# Patient Record
Sex: Male | Born: 1980 | Race: Black or African American | Hispanic: No | Marital: Single | Smoking: Current some day smoker
Health system: Southern US, Community
[De-identification: ages and names within clinical notes are randomized; demographics above are authoritative.]

## PROBLEM LIST (undated history)

## (undated) DIAGNOSIS — R590 Localized enlarged lymph nodes: Secondary | ICD-10-CM

## (undated) DIAGNOSIS — Z202 Contact with and (suspected) exposure to infections with a predominantly sexual mode of transmission: Secondary | ICD-10-CM

## (undated) DIAGNOSIS — R197 Diarrhea, unspecified: Secondary | ICD-10-CM

## (undated) DIAGNOSIS — A749 Chlamydial infection, unspecified: Secondary | ICD-10-CM

## (undated) DIAGNOSIS — I10 Essential (primary) hypertension: Secondary | ICD-10-CM

## (undated) DIAGNOSIS — A539 Syphilis, unspecified: Secondary | ICD-10-CM

## (undated) DIAGNOSIS — B20 Human immunodeficiency virus [HIV] disease: Secondary | ICD-10-CM

## (undated) DIAGNOSIS — J45909 Unspecified asthma, uncomplicated: Secondary | ICD-10-CM

## (undated) HISTORY — DX: Essential (primary) hypertension: I10

## (undated) HISTORY — DX: Diarrhea, unspecified: R19.7

## (undated) HISTORY — DX: Unspecified asthma, uncomplicated: J45.909

## (undated) HISTORY — DX: Syphilis, unspecified: A53.9

## (undated) HISTORY — DX: Chlamydial infection, unspecified: A74.9

## (undated) HISTORY — DX: Contact with and (suspected) exposure to infections with a predominantly sexual mode of transmission: Z20.2

## (undated) HISTORY — DX: Localized enlarged lymph nodes: R59.0

---

## 1999-08-05 ENCOUNTER — Ambulatory Visit: Admission: RE | Admit: 1999-08-05 | Discharge: 1999-08-05 | Payer: Self-pay | Admitting: Internal Medicine

## 2003-02-28 ENCOUNTER — Other Ambulatory Visit: Admission: RE | Admit: 2003-02-28 | Discharge: 2003-02-28 | Payer: Self-pay | Admitting: Pulmonary Disease

## 2008-10-06 ENCOUNTER — Emergency Department (HOSPITAL_BASED_OUTPATIENT_CLINIC_OR_DEPARTMENT_OTHER): Admission: EM | Admit: 2008-10-06 | Discharge: 2008-10-06 | Payer: Self-pay | Admitting: Emergency Medicine

## 2010-08-31 ENCOUNTER — Emergency Department (HOSPITAL_BASED_OUTPATIENT_CLINIC_OR_DEPARTMENT_OTHER): Admission: EM | Admit: 2010-08-31 | Discharge: 2010-08-31 | Payer: Self-pay | Admitting: Emergency Medicine

## 2010-10-05 ENCOUNTER — Inpatient Hospital Stay (HOSPITAL_COMMUNITY): Admission: EM | Admit: 2010-10-05 | Discharge: 2010-10-06 | Payer: Self-pay | Admitting: Emergency Medicine

## 2010-11-10 ENCOUNTER — Telehealth: Payer: Self-pay | Admitting: Infectious Diseases

## 2010-11-10 ENCOUNTER — Ambulatory Visit: Payer: Self-pay | Admitting: Adult Health

## 2010-11-10 DIAGNOSIS — B20 Human immunodeficiency virus [HIV] disease: Secondary | ICD-10-CM

## 2010-11-10 LAB — CONVERTED CEMR LAB
ALT: 15 units/L (ref 0–53)
AST: 22 units/L (ref 0–37)
Albumin: 4.2 g/dL (ref 3.5–5.2)
Alkaline Phosphatase: 75 units/L (ref 39–117)
BUN: 12 mg/dL (ref 6–23)
Basophils Absolute: 0 10*3/uL (ref 0.0–0.1)
Basophils Relative: 0 % (ref 0–1)
CO2: 24 meq/L (ref 19–32)
Calcium: 9 mg/dL (ref 8.4–10.5)
Chlamydia, Swab/Urine, PCR: NEGATIVE
Chloride: 99 meq/L (ref 96–112)
Cholesterol: 99 mg/dL (ref 0–200)
Creatinine, Ser: 0.93 mg/dL (ref 0.40–1.50)
Eosinophils Absolute: 0.1 10*3/uL (ref 0.0–0.7)
Eosinophils Relative: 4 % (ref 0–5)
GC Probe Amp, Urine: NEGATIVE
Glucose, Bld: 87 mg/dL (ref 70–99)
HCT: 37.3 % — ABNORMAL LOW (ref 39.0–52.0)
HCV Ab: NEGATIVE
HDL: 19 mg/dL — ABNORMAL LOW (ref >39)
HIV 1 RNA Quant: 114000 copies/mL — ABNORMAL HIGH (ref <20)
HIV-1 RNA Quant, Log: 5.06 — ABNORMAL HIGH (ref <1.30)
HIV-1 antibody: POSITIVE — AB
HIV-2 Ab: NEGATIVE
HIV: REACTIVE
Hemoglobin, Urine: NEGATIVE
Hemoglobin: 12.4 g/dL — ABNORMAL LOW (ref 13.0–17.0)
Hep A Total Ab: POSITIVE — AB
Hep B Core Total Ab: NEGATIVE
Hep B S Ab: POSITIVE — AB
Hepatitis B Surface Ag: NEGATIVE
Ketones, ur: NEGATIVE mg/dL
LDL Cholesterol: 63 mg/dL (ref 0–99)
Leukocytes, UA: NEGATIVE
Lymphocytes Relative: 35 % (ref 12–46)
Lymphs Abs: 1.2 10*3/uL (ref 0.7–4.0)
MCHC: 33.2 g/dL (ref 30.0–36.0)
MCV: 81.4 fL (ref 78.0–100.0)
Monocytes Absolute: 0.3 10*3/uL (ref 0.1–1.0)
Monocytes Relative: 8 % (ref 3–12)
Neutro Abs: 1.8 10*3/uL (ref 1.7–7.7)
Neutrophils Relative %: 53 % (ref 43–77)
Nitrite: NEGATIVE
Platelets: 431 10*3/uL — ABNORMAL HIGH (ref 150–400)
Potassium: 4.2 meq/L (ref 3.5–5.3)
Protein, ur: NEGATIVE mg/dL
RBC: 4.58 M/uL (ref 4.22–5.81)
RDW: 13.9 % (ref 11.5–15.5)
RPR Ser Ql: REACTIVE — AB
RPR Titer: 1:64 {titer} — AB
Sodium: 135 meq/L (ref 135–145)
Specific Gravity, Urine: 1.025 (ref 1.005–1.030)
T pallidum Antibodies (TP-PA): >8 — ABNORMAL HIGH (ref <0.90)
Total Bilirubin: 0.4 mg/dL (ref 0.3–1.2)
Total CHOL/HDL Ratio: 5.2
Total Protein: 8.8 g/dL — ABNORMAL HIGH (ref 6.0–8.3)
Triglycerides: 87 mg/dL (ref <150)
Urine Glucose: NEGATIVE mg/dL
Urobilinogen, UA: 1 (ref 0.0–1.0)
VLDL: 17 mg/dL (ref 0–40)
WBC: 3.4 10*3/uL — ABNORMAL LOW (ref 4.0–10.5)
pH: 6 (ref 5.0–8.0)

## 2010-11-17 DIAGNOSIS — F329 Major depressive disorder, single episode, unspecified: Secondary | ICD-10-CM | POA: Insufficient documentation

## 2010-11-18 ENCOUNTER — Ambulatory Visit: Payer: Self-pay | Admitting: Adult Health

## 2010-11-18 DIAGNOSIS — B370 Candidal stomatitis: Secondary | ICD-10-CM | POA: Insufficient documentation

## 2010-11-18 DIAGNOSIS — R05 Cough: Secondary | ICD-10-CM | POA: Insufficient documentation

## 2010-11-18 DIAGNOSIS — A5149 Other secondary syphilitic conditions: Secondary | ICD-10-CM | POA: Insufficient documentation

## 2010-11-18 DIAGNOSIS — R291 Meningismus: Secondary | ICD-10-CM | POA: Insufficient documentation

## 2010-11-19 ENCOUNTER — Inpatient Hospital Stay (HOSPITAL_COMMUNITY): Admission: EM | Admit: 2010-11-19 | Discharge: 2010-11-20 | Payer: Self-pay | Admitting: Emergency Medicine

## 2010-11-20 ENCOUNTER — Encounter (INDEPENDENT_AMBULATORY_CARE_PROVIDER_SITE_OTHER): Payer: Self-pay | Admitting: *Deleted

## 2010-11-23 ENCOUNTER — Emergency Department (HOSPITAL_BASED_OUTPATIENT_CLINIC_OR_DEPARTMENT_OTHER)
Admission: EM | Admit: 2010-11-23 | Discharge: 2010-11-23 | Payer: Self-pay | Source: Home / Self Care | Admitting: Emergency Medicine

## 2010-11-23 ENCOUNTER — Encounter (INDEPENDENT_AMBULATORY_CARE_PROVIDER_SITE_OTHER): Payer: Self-pay | Admitting: *Deleted

## 2010-11-27 ENCOUNTER — Ambulatory Visit: Payer: Self-pay | Admitting: Adult Health

## 2010-11-27 DIAGNOSIS — G971 Other reaction to spinal and lumbar puncture: Secondary | ICD-10-CM

## 2010-12-03 ENCOUNTER — Ambulatory Visit: Payer: Self-pay | Admitting: Adult Health

## 2010-12-03 DIAGNOSIS — F411 Generalized anxiety disorder: Secondary | ICD-10-CM

## 2010-12-04 ENCOUNTER — Ambulatory Visit: Payer: Self-pay | Admitting: Adult Health

## 2010-12-25 ENCOUNTER — Ambulatory Visit
Admission: RE | Admit: 2010-12-25 | Discharge: 2010-12-25 | Payer: Self-pay | Source: Home / Self Care | Attending: Adult Health | Admitting: Adult Health

## 2010-12-25 DIAGNOSIS — G4700 Insomnia, unspecified: Secondary | ICD-10-CM | POA: Insufficient documentation

## 2010-12-25 DIAGNOSIS — R21 Rash and other nonspecific skin eruption: Secondary | ICD-10-CM | POA: Insufficient documentation

## 2010-12-25 DIAGNOSIS — L851 Acquired keratosis [keratoderma] palmaris et plantaris: Secondary | ICD-10-CM | POA: Insufficient documentation

## 2010-12-29 ENCOUNTER — Encounter (INDEPENDENT_AMBULATORY_CARE_PROVIDER_SITE_OTHER): Payer: Self-pay | Admitting: *Deleted

## 2011-01-04 ENCOUNTER — Ambulatory Visit
Admission: RE | Admit: 2011-01-04 | Discharge: 2011-01-04 | Payer: Self-pay | Source: Home / Self Care | Attending: Adult Health | Admitting: Adult Health

## 2011-01-18 ENCOUNTER — Ambulatory Visit
Admission: RE | Admit: 2011-01-18 | Discharge: 2011-01-18 | Payer: Self-pay | Source: Home / Self Care | Attending: Adult Health | Admitting: Adult Health

## 2011-01-18 ENCOUNTER — Encounter: Payer: Self-pay | Admitting: Adult Health

## 2011-01-18 LAB — CONVERTED CEMR LAB
ALT: 12 units/L (ref 0–53)
AST: 19 units/L (ref 0–37)
Albumin: 4.3 g/dL (ref 3.5–5.2)
Alkaline Phosphatase: 96 units/L (ref 39–117)
BUN: 13 mg/dL (ref 6–23)
Basophils Absolute: 0 10*3/uL (ref 0.0–0.1)
Basophils Relative: 1 % (ref 0–1)
CO2: 24 meq/L (ref 19–32)
Calcium: 9 mg/dL (ref 8.4–10.5)
Chloride: 104 meq/L (ref 96–112)
Creatinine, Ser: 0.92 mg/dL (ref 0.40–1.50)
Eosinophils Absolute: 0.3 10*3/uL (ref 0.0–0.7)
Eosinophils Relative: 7 % — ABNORMAL HIGH (ref 0–5)
Glucose, Bld: 94 mg/dL (ref 70–99)
HCT: 40 % (ref 39.0–52.0)
HIV 1 RNA Quant: 66 copies/mL — ABNORMAL HIGH (ref <20)
HIV-1 RNA Quant, Log: 1.82 — ABNORMAL HIGH (ref <1.30)
Hemoglobin: 12.7 g/dL — ABNORMAL LOW (ref 13.0–17.0)
Lymphocytes Relative: 52 % — ABNORMAL HIGH (ref 12–46)
Lymphs Abs: 2.4 10*3/uL (ref 0.7–4.0)
MCHC: 31.8 g/dL (ref 30.0–36.0)
MCV: 88.3 fL (ref 78.0–100.0)
Monocytes Absolute: 0.5 10*3/uL (ref 0.1–1.0)
Monocytes Relative: 11 % (ref 3–12)
Neutro Abs: 1.3 10*3/uL — ABNORMAL LOW (ref 1.7–7.7)
Neutrophils Relative %: 29 % — ABNORMAL LOW (ref 43–77)
Platelets: 293 10*3/uL (ref 150–400)
Potassium: 4.1 meq/L (ref 3.5–5.3)
RBC: 4.53 M/uL (ref 4.22–5.81)
RDW: 14.8 % (ref 11.5–15.5)
Sodium: 139 meq/L (ref 135–145)
Total Bilirubin: 0.4 mg/dL (ref 0.3–1.2)
Total Protein: 7.8 g/dL (ref 6.0–8.3)
WBC: 4.5 10*3/uL (ref 4.0–10.5)

## 2011-01-19 NOTE — Progress Notes (Addendum)
Summary: Rash   Phone Note Call from Patient   Summary of Call: Pt states rash appeared on his body abut 15 mins after taking the Dapsone.   Then itching occured .  He states he took the Septra about 20 mins before coming to the clinic and then took the Dapsone samples given after the intake.    Pt was advised to D/C the Septra.  Do not take the Septra or the Dapsone for two days then restart the Dapson on Friday and observe for symptoms of poss. allergic reaction. Pt was told it was probably the Septra .

## 2011-01-19 NOTE — Miscellaneous (Signed)
Summary: HIPAA Restrictions  HIPAA Restrictions   Imported By: Florinda Marker 11/17/2010 09:19:54  _____________________________________________________________________  External Attachment:    Type:   Image     Comment:   External Document

## 2011-01-19 NOTE — Miscellaneous (Signed)
Summary: RW Financial Update  Clinical Lists Changes  Observations: Added new observation of RWPARTICIP: Yes (11/20/2010 12:39) Added new observation of RWTITLE: B (11/20/2010 12:39) Added new observation of AIDSDAP: Pending-mailed app 11/20/10 (11/20/2010 12:39) Added new observation of PCTFPL: 97.29  (11/20/2010 12:39) Added new observation of INCOMESOURCE: wages  (11/20/2010 12:39) Added new observation of HOUSEINCOME: 16109  (11/20/2010 12:39) Added new observation of #CHILD<18 IN: No  (11/20/2010 12:39) Added new observation of FAMILYSIZE: 1  (11/20/2010 12:39) Added new observation of HOUSING: Stable/permanent  (11/20/2010 12:39) Added new observation of FINASSESSDT: 11/20/2010  (11/20/2010 12:39) Added new observation of YEARLYEXPEN: 0  (11/20/2010 12:39)

## 2011-01-19 NOTE — Miscellaneous (Signed)
Summary: ADAP approval   Clinical Lists Changes  Observations: Added new observation of AIDSDAP: Yes 2011 (11/23/2010 13:04)

## 2011-01-19 NOTE — Assessment & Plan Note (Signed)
Summary: new 042   / whole body pain X 1 week   Visit Type:  New Patient Primary Provider:  Talmadge Chad NP  CC:  body pain, fever, and chills.  History of Present Illness: 30 y/o African-American male with h/o HIV since 2004 in for initial eval.  He has never been on ARV therapy in the past. He was followed by [facility] for HIV "a few years ago" but stopped due to frustration with the system there.  Recently treated at [facility] for fever & "body aches,"  and at that time was sent home on Bactrim DS by mouth for PCP prophylaxis.  1 week back he called clinic stating he developed a "reaction" to Bactrim which was subsequently treated by cessation of drug, but now claims rash changing character, developing fevers with Tmax 103, dry cough, diffuse arthargias and myalgias, loss of appetite, generalized neck stiffness, weakness, nonexretional fatigue.   Denies SOB, DOE, or orthopnea.  Denies nausea, vomitting, or diarrhea.  No reported seizures, syncopal episodes.  Also relates deveoping "sores" on soles of feet during this same period of time.  Preventive Screening-Counseling & Management  Alcohol-Tobacco     Smoking Status: never      Drug Use:  no.     Current Allergies (reviewed today): ! BACTRIM Past History:  Past Medical History: HIV disease - 2004  Social History: Occupation:student Single Never Smoked Alcohol use-yes Drug use-no Drug Use:  no  Review of Systems General:  Complains of chills, fatigue, fever, loss of appetite, malaise, sweats, weakness, and weight loss. Eyes:  Denies blurring, discharge, double vision, eye irritation, eye pain, halos, itching, light sensitivity, red eye, vision loss-1 eye, and vision loss-both eyes. ENT:  Complains of ringing in ears; denies decreased hearing, difficulty swallowing, ear discharge, earache, hoarseness, nasal congestion, nosebleeds, postnasal drainage, sinus pressure, and sore throat. CV:  Complains of fatigue; denies bluish  discoloration of lips or nails, chest pain or discomfort, difficulty breathing at night, difficulty breathing while lying down, fainting, leg cramps with exertion, lightheadness, near fainting, palpitations, shortness of breath with exertion, swelling of feet, swelling of hands, and weight gain. Resp:  Complains of cough; dry nonproductive, inspiratory in nature. GI:  Complains of loss of appetite; denies abdominal pain, bloody stools, change in bowel habits, constipation, dark tarry stools, diarrhea, excessive appetite, gas, hemorrhoids, indigestion, nausea, vomiting, vomiting blood, and yellowish skin color. GU:  Denies decreased libido, discharge, dysuria, erectile dysfunction, genital sores, hematuria, incontinence, nocturia, urinary frequency, and urinary hesitancy. MS:  Complains of joint pain, loss of strength, low back pain, mid back pain, muscle aches, and stiffness; diffuse arthalgias, myalgias, "stiff neck". Derm:  Complains of dryness, lesion(s), and rash; denies changes in color of skin, changes in nail beds, excessive perspiration, flushing, hair loss, insect bite(s), itching, and poor wound healing; "sores" on soles of feet, rash "all over". Neuro:  Complains of disturbances in coordination, headaches, poor balance, and weakness; denies brief paralysis, difficulty with concentration, falling down, inability to speak, memory loss, numbness, seizures, sensation of room spinning, tingling, tremors, and visual disturbances. Psych:  Complains of depression and easily tearful; denies alternate hallucination ( auditory/visual), anxiety, easily angered, irritability, mental problems, panic attacks, sense of great danger, suicidal thoughts/plans, thoughts of violence, unusual visions or sounds, and thoughts /plans of harming others. Endo:  Complains of excessive thirst; denies cold intolerance, excessive hunger, excessive urination, heat intolerance, polyuria, and weight change. Heme:  Complains of  enlarge lymph nodes and fevers; denies abnormal bruising,  bleeding, pallor, and skin discoloration. Allergy:  Complains of hives or rash; denies itching eyes, persistent infections, seasonal allergies, and sneezing.  Vital Signs:  Patient profile:   30 year old male Height:      0 inches (0 cm) Weight:      0 pounds (0 kg) Temp:     100.5 degrees F (38.06 degrees C) Pulse rate:   114 / minute BP sitting:   101 / 64  (left arm)  Vitals Entered By: Starleen Arms CMA (November 18, 2010 3:47 PM) CC: body pain, fever, chills Is Patient Diabetic? No Pain Assessment Patient in pain? yes     Location: whole body  Does patient need assistance? Functional Status Self care   Physical Exam  General:  cooperative to examination, underweight appearing, dusky, disheveled, and mild distress.   Head:  Normocephalic and atraumatic without obvious abnormalities. No apparent alopecia or balding. Eyes:  No corneal or conjunctival inflammation noted. EOMI. Perrla. Funduscopic exam benign, without hemorrhages, exudates or papilledema.   Wears corrective lenses Ears:  External ear exam shows no significant lesions or deformities.  Otoscopic examination reveals clear canals, tympanic membranes are intact bilaterally without bulging, retraction, inflammation or discharge. Hearing is grossly normal bilaterally. Nose:  External nasal examination shows no deformity or inflammation. Nasal mucosa are pink and moist without lesions or exudates. Mouth:  fair dentition, halitosis, angular chelitis, and white plaque(s), mostly isolated to soft palate and anterior pillars. Neck:  no masses, no thyromegaly, no thyroid nodules or tenderness, no JVD, no HJR, nuchal rigidity, decreased ROM, and cervical lymphadenopathy  Chest Wall:  No deformities, masses, tenderness or gynecomastia noted. Lungs:  tachypnic, no intercostal retractions, no accessory muscle use, no dullness, no fremitus, no crackles, no wheezes, R  decreased breath sounds, and L decreased breath sounds (in bases only).   Heart:  regular rhythm, no murmur, no gallop, no rub, no lifts, no heaves, no thrills, PMI normal, and tachycardia.   Abdomen:  Bowel sounds positive,abdomen soft and non-tender without masses, organomegaly or hernias noted. Rectal:  Deferred this visit Genitalia:  Deferred Prostate:  NA Msk:  no joint swelling, no joint warmth, no redness over joints, no joint deformities, no joint instability, no crepitation, no muscle atrophy, and joint tenderness.   Pulses:  R and L carotid,radial,femoral,dorsalis pedis and posterior tibial pulses are full and equal bilaterally Extremities:  No clubbing, cyanosis, edema, or deformity noted with normal full range of motion of all joints.   Neurologic:  alert & oriented X3, cranial nerves II-XII intact, strength normal in all extremities, sensation intact to light touch, sensation intact to pinprick, and DTRs symmetrical and normal.  Gait not well-assessed due to weakness.  Verbal responses appropriate, but slow. Skin:  decreased turgor and macular rash diffuse, with extension to abd, legs, and plantar surface of feet. Cervical Nodes:  R anterior LN enlarged, R posterior LN enlarged, L anterior LN enlarged, and L posterior LN enlarged.  (A&P cervical nodes enlarged aqpprox 0.5cm or smaller, soft, mobile, but nontender).   Axillary Nodes:  R axillary LN enlarged and L axillary LN enlarged.   Inguinal Nodes:  R inguinal LN enlarged and L  inguinal LN enlarged.   Psych:  Oriented X3, memory intact for recent and remote, depressed affect, subdued, poor eye contact, and tearful.     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 Due to his debilitated state and dehydration, he was sent to ER for fluid resuscitation and w/u for neurosyphilis. 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 Should send CSF for serum CrAg, blood cults and blood for AFB, viral titers.  Problem # 3:  COUGH, NON-PRODUCTIVE (ICD-786.2) Pulse ox 96 on RA, denies SOB or DOE, but cough is inspiratory and dry in character.   Would consider empiric PCP treatment, but obtain baseline CXR and repeat after fluid hydration if initially clear.  Problem # 4:  HIV DISEASE (ICD-042) CD4 100 @ 8%, clinically symptomatic, VL  114K.  Due to current acute illness, ARV initiation was deferred until he is further evaled and treated.  ARV therapy definitely is warranted, is currently on PCP prophylaxis with Dapsone.  He is to schedule 1 week follow-up appointment once discharged from [facility] so we may begin ARV therapy.  Patient Instructions: 1)  Go directly to [facility] ER for evaluation today. 2)  Once discharged from hospital, call clinic to schedule f/u appointment for 1 week.

## 2011-01-19 NOTE — Assessment & Plan Note (Signed)
Summary: Nurse Visit (Infectious Disease)   Infectious Disease New Patient Intake Referring MD: [facility]       Medical History Prior Medications:  Immunizations Administered:  PPD Skin Test:    Vaccine Type: PPD    Site: left forearm    Mfr: Sanofi Pasteur    Dose: 0.1 ml    Route: ID    Given by: Tomasita Morrow RN    Exp. Date: 06/25/2012    Lot #: [id number]  Pneumonia Vaccine:    Vaccine Type: Pneumovax    Site: right deltoid    Mfr: Merck    Dose: 0.5 ml    Route: IM    Given by: Tomasita Morrow RN    Exp. Date: 04/20/2012    Lot #: [id number]    VIS given: 11/24/09 version given November 10, 2010.  Orders Added: 1)  T-Chlamydia  Probe, urine [id number] 2)  T-CBC w/Diff [55732-20254] 3)  T-CD4SP [facility]) [CD4SP] 4)  T-GC Probe, urine [id number] 5)  T-Comprehensive Metabolic Panel [80053-22900] 6)  T-Hepatitis B Surface Antigen [31517-61607] 7)  T-Hepatitis B Surface Antibody [37106-26948] 8)  T-Hepatitis B Core Antibody [54627-03500] 9)  T-Hepatitis C Antibody [93818-29937] 10)  T-HIV Genotype [87901-83315] 11)  T-Hepatitis A Antibody [16967-89381] 12)  T-HIV Antibody  (Reflex) [01751-02585] 13)  T-HIV1 Quant rflx Ultra or Genotype [27782-42353] 14)  T-Lipid Profile [80061-22930] 15)  T-RPR (Syphilis) [61443-15400] 16)  T-Urinalysis [81003-65000] 17)  TB Skin Test [86580] 18)  Admin 1st Vaccine [90471] 19)  Pneumococcal Vaccine [90732] 20)  Admin of Any Addtl Vaccine [90472]  Appended Document: Nurse Visit (Infectious Disease)     Infectious Disease New Patient Intake Referring MD/Agency: Hospital F/U Address: [address] [place], [place] [address]  Return Appointment Date: 10/22/2011 Medical Records: Received Health Insurance / Payor: No Insurance Employer: Student    Does insurance cover prescriptions? No Our patient has been informed that medication assistance programs are available.  Our Co-ordinator will be meeting with the patient during this visit  to discuss financial and medication assistance.   Do you have a Primary physician: No Are family members aware of patient's diagnosis?  If so, are they supportive? Mother, who is supportive   Medical History Medical Hx Comments: Pt c/o night sweats and episodes shortness  of breath for 2 months  Recent D/C from [facility] for fever with body aches . He has never been on Medications for HIV and was attending [facility] for a while until a few years ago. He states he was frustrated with care at [facility] because he would see a different physician each time.   Family History Heart Disease: Yes Diabetes: Yes  Family Side: Maternal  Tobacco Use: never  Behavioral Health Assessment Have you ever been diagnosed with depression or mental illness? Yes  Diagnosis: anxiety  Currently depressed with anxiety Do you drink alcohol? Yes Frequency: rarely   Alcohol Beverage Type(s): wine Do you use recreational drugs? No Do you feel you have a problem with drugs and/or alcohol? No   Have you ever been in a treatment facility for any addiction? No  HIV Intake Information When did you first test positive for HIV? 02/21/2003 Type of test Conducted: WB   Where was this test performed?  City[place]State[place]  Was this your first time ever being tested or HIV? Yes Risk Factor(s) for HIV: MSM  Method of Exposure to HIV: Homosexual Intercourse-Receptive Other: PT tested positive while in the [place], [place]. Have you ever been hospitalized for any  HIV-related condition? Yes Hospital Name 1: [facility]   Reason: Flu like symptoms  Date: 10/05/2010  Newly Diagnosed Patients Has a Disease Intervention Specialist from the Health Department contacted the patient? No.   The patient has been informed that the [facility] Department will contact ALL newly reported cases. Health Department Contact:  [phone]   (SSN is needed for confirmation)  Health Department Contact:  [phone]            (SSN is needed for confirmation)  Do you have  any Non-HIV related medical conditions or other prior hospitalizations or surgeries? No  Infection History  Patient has been diagnosed with the following opportunistic infections: Are there any other symptoms you need to discuss? Yes Have you received literature/education prior to this visit about HIV/AIDS? Yes Lab Values Education/Handout Given Yes Medication Education/Handout Given Yes  Sexual History Are you in a current relationship? Yes How long have you been in this relationship? 3 months Are they aware of your diagnosis? Yes Have they been tested for HIV? Yes What were the results: Unknown Are you currently sexually active? No If no, when was your last encounter? 09/21/2010 When was your last unprotected sex? 0 Safe Sex Counseling/Pamphlet Given Sexual History Comments: condom use for oral sex   Male partners Last 6 months sexual intercourse :0   oral sex 1  Last year sexual intercourse : 1 Lifetime estimate: 20+   Evaluation and Follow-Up INTAKE CHECK LIST: HIV Education, Safe Sex Counseling, Case Management Referral, HIV Material Given  Prevention For Positives: 11/10/2010   Safe sex practices discussed with patient. Condoms offered. Are you in need of condoms at this time? No Our patient has been informed that condoms are always available in this clinic.   Brochure Provided for Above Organizations? Yes Name of Agency: [facility] Does patient have problems that warrant Social Worker referral? No Prior Medications: DAPSONE 100 MG TABS (DAPSONE) Take 1 tablet by mouth once a day Current Allergies (reviewed today): ! BACTRIM   -  Date:  10/05/2010    CD4: 130    CD4%: 9    Viral Load: 312000    Glucose: 105    Hemoglobin: 13.3    WBC: 5.4    Platelets: 180    Creatinine: 1.24             Prevention For Positives: 11/10/2010   Safe sex practices discussed with patient. Condoms offered.        11/10/2010   Patient was screened for substance abuse and  depression. Referal was made as indicated.                       Depression History:      The patient is having a depressed mood most of the day and has a diminished interest in his usual daily activities.        The patient denies that he feels like life is not worth living, denies that he wishes that he were dead, and denies that he has thought about ending his life.

## 2011-01-21 NOTE — Assessment & Plan Note (Signed)
Summary: F/U OV/VS   Primary Provider:  Talmadge Chad NP  CC:  pt. c/o side effects from new meds and paranoia.  History of Present Illness: After starting ARV therapy, had hyperactive episode, sensitive to sounds at night and paranoia.  Was told by on-call MD to stop ARV's and follow-up in clinic.  Currently no symptoms since being off meds.  Concerned it could "be just me."  Denies hallucinations or SI/HI.  Preventive Screening-Counseling & Management  Alcohol-Tobacco     Alcohol drinks/day: rarely       Smoking Status: never  Caffeine-Diet-Exercise     Caffeine use/day: soda occasional     Does Patient Exercise: no  Safety-Violence-Falls     Seat Belt Use: yes      Drug Use:  no.     Current Allergies (reviewed today): ! BACTRIM Review of Systems  The patient denies anorexia, fever, weight loss, weight gain, vision loss, decreased hearing, hoarseness, chest pain, syncope, dyspnea on exertion, peripheral edema, prolonged cough, headaches, hemoptysis, abdominal pain, melena, hematochezia, severe indigestion/heartburn, hematuria, incontinence, genital sores, muscle weakness, suspicious skin lesions, transient blindness, difficulty walking, depression, unusual weight change, abnormal bleeding, enlarged lymph nodes, angioedema, breast masses, and testicular masses.   Psych:  Complains of anxiety; denies alternate hallucination ( auditory/visual), depression, easily angered, easily tearful, irritability, mental problems, panic attacks, sense of great danger, suicidal thoughts/plans, thoughts of violence, unusual visions or sounds, and thoughts /plans of harming others.  Vital Signs:  Patient profile:   30 year old male Height:      77 inches (195.58 cm) Weight:      181.4 pounds (82.45 kg) BMI:     21.59 Temp:     98.4 degrees F (36.89 degrees C) oral Pulse rate:   114 / minute BP sitting:   147 / 94  (right arm)  Vitals Entered By: Wendall Mola CMA Duncan Dull)  (December 03, 2010 9:22 AM) CC: pt. c/o side effects from new meds, paranoia Is Patient Diabetic? No Pain Assessment Patient in pain? no      Nutritional Status BMI of 19 -24 = normal Nutritional Status Detail appetite "good"  Have you ever been in a relationship where you felt threatened, hurt or afraid?No   Does patient need assistance? Functional Status Self care Ambulation Normal Comments pt. has stopped HAART meds   Physical Exam  General:  Well-developed,well-nourished,in no acute distress; alert,appropriate and cooperative throughout examination Lungs:  Normal respiratory effort, chest expands symmetrically. Lungs are clear to auscultation, no crackles or wheezes. Heart:  Normal rate and regular rhythm. S1 and S2 normal without gallop, murmur, click, rub or other extra sounds. Skin:  Intact without suspicious lesions or rashes Psych:  Cognition and judgment appear intact. Alert and cooperative with normal attention span and concentration. No apparent delusions, illusions, hallucinations   Impression & Recommendations:  Problem # 1:  ANXIETY STATE, UNSPECIFIED (ICD-300.00)  A review of data shows unlikely relationship with meds.  However, Isentress can cause insomnia over longer period of therapy.  He has agreed to try the regimen again, but our plan is to wait until afterholidays to resume reimen with a sheduled 2 week follow-up after re-start.  If he re-develops symptoms we will switch-off Isentress and try Prezista/Norvir instead.  He agrees with this and will contact us if symptoms re-develop. His updated medication list for this problem includes:    Trazodone Hcl 50 Mg Tabs (Trazodone hcl) .[name]... 1/2 to 1 tablet by mouth at bedtime as  needed sleep.  Orders: Est. Patient Level III (40981)

## 2011-01-21 NOTE — Miscellaneous (Signed)
  Clinical Lists Changes  Medications: Removed medication of TRAZODONE HCL 50 MG TABS (TRAZODONE HCL) 1/2 to 1 tablet by mouth at bedtime as needed sleep. Added new medication of TRAZODONE HCL 50 MG TABS (TRAZODONE HCL) 2 tablets by mouth at bedtime as needed sleep

## 2011-01-21 NOTE — Assessment & Plan Note (Signed)
Summary: f/u from lab result[mkj]   Primary Provider:  Talmadge Chad NP  CC:  follow-up visit and needs refill Dapsone.  History of Present Illness: In for f/u 2 weeks re-starting ARV therapy.  States tolerating meds well, and not having bad dreams, especially when he is staying with his friend.  When he is staying with his mother, he claims higher anxiety, bad dreams, and not being able to sleep in spite of doubling trazodone dosage.  Still occassionally having "rash" on left ear, but more manageable now.  Denies any vessicle formation or current pruritis.  Also relates chronic cough he has which recently caused him to have eye pain post-tussive.  Denies fever, chills, sweats, SOB or DOE.  Appetite good.  Adherent with meds.  Preventive Screening-Counseling & Management  Alcohol-Tobacco     Alcohol drinks/day: socially     Smoking Status: never  Caffeine-Diet-Exercise     Caffeine use/day: soda occasional     Does Patient Exercise: no  Safety-Violence-Falls     Seat Belt Use: yes      Sexual History:  currently monogamous.        Drug Use:  no.    Comments: pt. declined condoms   Current Allergies (reviewed today): ! BACTRIM Social History: Sexual History:  currently monogamous  Review of Systems       Chronic dry cough as outlined in HPI.  Headaches post-tussive, but no "spinal" headaches as previously reported.  Sleep disturbances when sleeping at mother's home, but not while staying with friend.  Vital Signs:  Patient profile:   30 year old male Height:      77 inches (195.58 cm) Weight:      161.4 pounds (73.36 kg) BMI:     19.21 Temp:     97.6 degrees F (36.44 degrees C) oral Pulse rate:   99 / minute BP sitting:   122 / 83  (left arm)  Vitals Entered By: Wendall Mola CMA Duncan Dull) (January 04, 2011 10:09 AM) CC: follow-up visit, needs refill Dapsone Is Patient Diabetic? No Pain Assessment Patient in pain? no      Nutritional Status BMI of 19  -24 = normal Nutritional Status Detail appetite "good"  Have you ever been in a relationship where you felt threatened, hurt or afraid?Unable to ask   Does patient need assistance? Functional Status Self care Ambulation Normal Comments pt. missed one dose of Isentress and three doses of Dapsone   Physical Exam  General:  Well-developed,well-nourished,in no acute distress; alert,appropriate and cooperative throughout examination Head:  Normocephalic and atraumatic without obvious abnormalities. No apparent alopecia or balding. Eyes:  No corneal or conjunctival inflammation noted. EOMI. Perrla.  Vision grossly normal. Ears:  No identifiable rash, wheals, or lesions behid either ear noted.  Some dry keratosis noted behind left ear. Mouth:  Oral mucosa and oropharynx without lesions or exudates.  Teeth in good repair. Neck:  No deformities, masses, or tenderness noted. Lungs:  Normal respiratory effort, chest expands symmetrically. Lungs are clear to auscultation, no crackles or wheezes. Heart:  Normal rate and regular rhythm. S1 and S2 normal without gallop, murmur, click, rub or other extra sounds. Abdomen:  Bowel sounds positive,abdomen soft and non-tender without masses, organomegaly or hernias noted. Rectal:  Deferred Genitalia:  Deferred Prostate:  deferred Msk:  No deformity or scoliosis noted of thoracic or lumbar spine.   Neurologic:  No cranial nerve deficits noted. Station and gait are normal.  Sensory, motor and coordinative functions appear intact.  Skin:  See "Ears"  Cervical Nodes:  Shoddy A&P LAD Psych:  Cognition and judgment appear intact. Alert and cooperative with normal attention span and concentration. No apparent delusions, illusions, hallucinations   Impression & Recommendations:  Problem # 1:  AIDS (ICD-042) More tolerant on ARV regimen.  We will continue present management, call refills for meds, and ask him to return in 2 weeks for repeat staging labs with a  follow-up in 4 weeks.  Verbally acknowledged this and agreed with plan of care.  Problem # 2:  ANXIETY STATE, UNSPECIFIED (ICD-300.00) Insomnia appears to have relational nature with anxiety in home environment with parents.  We discussed long-term plans for moving in with friend which apparently remains in a planning stage.  We will follow-up with this on RTC.  Meanwhile, we will discontinue trazodone and try low-dose lorazepam (1mg  at first) then progress to 2mg  as needed for anxiety and sleep at bedtime as needed.  Verbally acknowledged this and agreed with plan of care.  will f/u response on next clinic visit. The following medications were removed from the medication list:    Trazodone Hcl 50 Mg Tabs (Trazodone hcl) .[name]... 2 tablets by mouth at bedtime as needed sleep His updated medication list for this problem includes:    Lorazepam 2 Mg Tabs (Lorazepam) .[name]... 1/2 - 1 tablet by mouth at bedtime as needed for sleep  Problem # 3:  COUGH, NON-PRODUCTIVE (ICD-786.2) Seems to be remaining residual from past acute illness.  If it remains on f/u will repeat CXR or sooner if fever develops, or SOB/DOE.  Problem # 4:  XERODERMA (ICD-701.1) Encouraged to use lotion to ears more frequently as previously discussed as this appears to be a continuing issue.  Will f/u on RTC.  Medications Added to Medication List This Visit: 1)  Lorazepam 2 Mg Tabs (Lorazepam) .... 1/2 - 1 tablet by mouth at bedtime as needed for sleep  Other Orders: Est. Patient Level III (23762) Future Orders: T-CBC w/Diff ([id number]) ... 01/18/2011 T-CD4SP ([facility]) (CD4SP) ... 01/18/2011 T-Comprehensive Metabolic Panel [id number]) ... 01/18/2011 T-HIV Viral Load [id number]) ... 01/18/2011 Prescriptions: LORAZEPAM 2 MG TABS (LORAZEPAM) 1/2 - 1 tablet by mouth at bedtime as needed for sleep  #30 x 0   Entered and Authorized by:   Talmadge Chad NP   Signed by:   Talmadge Chad NP on 01/04/2011   Method used:    Print then Give to Patient   RxID:   [id number] TRUVADA 200-300 MG TABS (EMTRICITABINE-TENOFOVIR) Take one (1) tablet once a day  #30 x 5   Entered by:   Wendall Mola CMA ( AAMA)   Authorized by:   Talmadge Chad NP   Signed by:   Wendall Mola CMA ( AAMA) on 01/04/2011   Method used:   Telephoned to ...       [facility] [phone]* (retail)       [address]       [address]       [place], [place]  [address]       Ph: [phone]       Fax: [phone]   RxID:   [id number] ISENTRESS 400 MG TABS (RALTEGRAVIR POTASSIUM) Take one (1) tablet every twelve (12) hours  #60 x 5   Entered by:   Wendall Mola CMA ( AAMA)   Authorized by:   Talmadge Chad NP   Signed by:   Wendall Mola CMA ( AAMA) on 01/04/2011   Method used:  Telephoned to ...       [facility] [phone]* (retail)       [address]       [address]       [place], [place]  [address]       Ph: [phone]       Fax: [phone]   RxID:   [id number] DAPSONE 100 MG TABS (DAPSONE) Take 1 tablet by mouth once a day  #31 x 11   Entered by:   Wendall Mola CMA ( AAMA)   Authorized by:   Talmadge Chad NP   Signed by:   Wendall Mola CMA ( AAMA) on 01/04/2011   Method used:   Telephoned to ...       [facility] [phone]* (retail)       [address]       [address]       [place], [place]  [address]       Ph: [phone]       Fax: [phone]   RxID:   [id number]      Immunization History:  Influenza Immunization History:    Influenza:  historical (11/10/2009)

## 2011-01-21 NOTE — Assessment & Plan Note (Signed)
Summary: hsfu/TY   Vital Signs:  Patient profile:   30 year old male Height:      77 inches Weight:      182 pounds (82.73 kg) BMI:     21.66 Temp:     98.1 degrees F (36.72 degrees C) oral Pulse rate:   92 / minute BP sitting:   131 / 85  (left arm)  Vitals Entered By: Starleen Arms CMA (November 27, 2010 10:08 AM) CC: hsfu Is Patient Diabetic? No Pain Assessment Patient in pain? yes     Location: neck Intensity: 5 Type: aching Onset of pain  With activity  Does patient need assistance? Functional Status Self care Ambulation Normal   Primary Provider:  Talmadge Chad NP  CC:  hsfu.  History of Present Illness: Returns to clinic 1 week post hospitalization for fever, rash, and suspected neurosyphilis.  A lumbar puncture was performed on admission which showed no active signs syphilis in the CNS.  He was fluid resuscitated and given IV Penecillin for two days prior to discharge.  Orders by ID on discharge were for weekly IM Pen G 2.4 million U IM x 3 weeks and continue Dapsone PCP prophylaxis.  He returns to begin weekly injections and start ARV therapy for HIV.  He c/o intermittent severe post LP headaches, that radiate from his neck up the back of his head and to the front.  He reports headaches were severe enough he had to report to the ER where he was hydrated, given Morphine Sulfate IV and promethiazine. He claims medications made him "hyper" but did provide relief from headache.  He was given an Rx for Percocet for severe for which he has only taken 1 dose.  He still c/o dull aching headache, but not enough for him to take Percocet.  He also relates a long-term problem with sleep and night terrors.  He denies any visual or auditory hallucinations.  He denies any depression, SI, or agitation.  However, admits to being chronically fatigued.  Denies fever, chills, sweats, SOB or DOE.  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  Claims appetite is good, but loses interest in eating after he starts a meal.  Denies dysphagia or odynophagia.  Allergies: 1)  ! Bactrim  Review of Systems General:  See HPI; Complains of fatigue, loss of appetite, and malaise; denies chills, fever, sleep disorder, sweats, weakness, and weight loss. Eyes:  Denies blurring, discharge, double vision, eye irritation, eye pain, halos, itching, light sensitivity, red eye, vision loss-1 eye, and vision loss-both eyes; Does wear corrective lenses.. ENT:  Denies decreased hearing, difficulty swallowing, ear discharge, earache, hoarseness, nasal congestion, nosebleeds, postnasal drainage, ringing in ears, sinus pressure, and sore throat. CV:  Complains of fatigue; denies bluish discoloration of lips or nails, chest pain or discomfort, difficulty breathing at night, difficulty breathing while lying down, fainting, leg cramps with exertion, lightheadness, near fainting, palpitations, shortness of breath with exertion, swelling of feet, swelling of hands, and weight gain. Resp:  See HPI; Complains of cough; denies chest discomfort, chest pain with inspiration, coughing up blood, excessive snoring, hypersomnolence, morning headaches, pleuritic, shortness of breath, sputum productive, and wheezing. GI:  Denies abdominal pain, bloody stools, change in bowel habits, constipation, dark tarry stools, diarrhea, excessive appetite, gas, hemorrhoids, indigestion, loss of appetite, nausea, vomiting, vomiting blood, and yellowish skin color. GU:  Denies decreased libido, discharge, dysuria, erectile dysfunction, genital sores,  hematuria, incontinence, nocturia, urinary frequency, and urinary hesitancy. MS:  See HPI; Denies joint pain, joint redness, joint swelling, loss of strength, low back pain, mid back pain, muscle aches, muscle , cramps, muscle weakness, stiffness, and thoracic pain. Derm:  Complains of rash; denies changes in color of skin, changes  in nail beds, dryness, excessive perspiration, flushing, hair loss, insect bite(s), itching, lesion(s), and poor wound healing; Rash over body reportedly subsiding. Neuro:  See HPI; Complains of headaches; denies brief paralysis, difficulty with concentration, disturbances in coordination, falling down, inability to speak, memory loss, numbness, poor balance, seizures, sensation of room spinning, tingling, tremors, visual disturbances, and weakness. Psych:  See HPI; Complains of anxiety and depression; denies alternate hallucination ( auditory/visual), easily angered, easily tearful, irritability, mental problems, panic attacks, sense of great danger, suicidal thoughts/plans, thoughts of violence, unusual visions or sounds, and thoughts /plans of harming others. Endo:  Complains of weight change; denies cold intolerance, excessive hunger, excessive thirst, excessive urination, heat intolerance, and polyuria. Heme:  Denies abnormal bruising, bleeding, enlarge lymph nodes, fevers, pallor, and skin discoloration. Allergy:  Complains of hives or rash; denies itching eyes, persistent infections, seasonal allergies, and sneezing; See "Derm". Exposures:  Denies HIV exposure, EBV exposure, TB exposure, exposure to sick animals, exposure to sick people, exposure to unusual animals, exposure to small children, exposure to caves/spelunking, exposure to bats, exposure to hunting/wild game, exposure to stagnant or pond water, exposure to salt water, exposure to marine animals/shellfish, animal bites, cat scratches, tick bites, eating raw eggs, eating raw chicken, eating raw fish/shellfish, blood transfusion, ingestion of well water, water vapor exposure, history of needle use/puncture, history of antibiotic use (last 2 mo.), and history of travel.  Physical Exam  General:  alert, well-developed, well-hydrated, appropriate dress, cooperative to examination, good hygiene, and underweight appearing.   Head:   Normocephalic and atraumatic without obvious abnormalities. No apparent alopecia or balding. Eyes:  No corneal or conjunctival inflammation noted. EOMI. Perrla. Funduscopic exam benign, without hemorrhages, exudates or papilledema. Vision grossly normal. Ears:  External ear exam shows no significant lesions or deformities.  Otoscopic examination reveals clear canals, tympanic membranes are intact bilaterally without bulging, retraction, inflammation or discharge. Hearing is grossly normal bilaterally. Nose:  External nasal examination shows no deformity or inflammation. Nasal mucosa are pink and moist without lesions or exudates. Mouth:  good dentition, no gingival abnormalities, no dental plaque, pharynx pink and moist, and white plaque(s) noted on soft palate and anterior pillars.   Neck:  supple, no masses, no thyromegaly, no thyroid nodules or tenderness, no JVD, no HJR, no neck tenderness, (+) trapezoid stiffness and decreased ROM.   Chest Wall:  No deformities, masses, tenderness or gynecomastia noted. Lungs:  Normal respiratory effort, chest expands symmetrically. Lungs are clear to auscultation, no crackles or wheezes. Heart:  regular rhythm, no murmur, no gallop, no rub, no JVD, no HJR, physiological split S2, no lifts, no heaves, no thrills, PMI normal, and (+) tachycardia.   Abdomen:  Bowel sounds positive,abdomen soft and non-tender without masses, organomegaly or hernias noted. Rectal:  No external lesions noted Genitalia:  Testes bilaterally descended without nodularity, tenderness or masses. No scrotal masses or lesions. No penis lesions or urethral discharge. Prostate:  Deferred Msk:  No deformity or scoliosis noted of thoracic or lumbar spine.   Pulses:  R and L carotid,radial,femoral,dorsalis pedis and posterior tibial pulses are full and equal bilaterally Extremities:  No clubbing, cyanosis, edema, or deformity noted with normal full range of motion  of all joints.   Neurologic:  No  cranial nerve deficits noted. Station and gait are normal. Plantar reflexes are down-going bilaterally. DTRs are symmetrical throughout. Sensory, motor and coordinative functions appear intact. Skin:  Macropapular rash to trunk and abd appear clearing from previous assessment Cervical Nodes:  R anterior LN enlarged, R posterior LN enlarged, L anterior LN enlarged, and L posterior LN enlarged.  (A&P cervical nodes enlarged aqpprox 0.5cm or smaller, soft, mobile, but nontender).   Axillary Nodes:  R axillary LN enlarged and L axillary LN enlarged, shoddy.   Inguinal Nodes:  R inguinal LN enlarged and L  inguinal LN enlarged.   Psych:  Oriented X3, memory intact for recent and remote, normally interactive, not anxious appearing, not depressed appearing, but poor eye contact.     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  As per orders from ID, we will begin Pen G 2.4 million U IM today and provide a full three week once-weekly series, with a repeat RPR on next lab draw. Orders Est. Patient Level V (99215)Future Orders: T-RPR (Syphilis) ([id number]) ... 12/25/2010  Problem # 2:  CANDIDIASIS, ORAL (ICD-112.0) Fluconazole 100mg  by mouth once daily x 5 days.  Will re-evaluate on RTC.  If recurrent, will provide temporary suppressive therapy.  Problem # 3:  COUGH, NON-PRODUCTIVE (ICD-786.2) Chest exam negative, no subjective complaints, no abnormalities on CXR during hospitalization.  Will continue to monitor this.  Instructed if cough worsens, fever redevelops, or SOB or DOE to contact clinic.  Advised if he desires he may use OTC guaefenesin with dextromethorophan as per package instructions.  Problem # 4:  REACTION TO SPINAL OR LUMBAR PUNCTURE (ICD-349.0) Headaches chronically present after his LP last week.  Instructed to take Percocet as prescribed but not to  wait until headache is intolerable.  Instructed to increase oral fluid intake.  He may also use ibuprofen 400-600 mg q 4-6h as needed for headache as well.  Will re-eval on next visit.  Problem # 5:  DEPRESSION (ICD-311) Some vegetative signs evident, plus sleep pattern disturbances implies a longer-termed underlying process, but this may be due to either syphilis, recent viral syndrome, or HIV itself or more likely, all etiologies discussed.  We will start with trazodone 25-50mg  at bedtime as needed for sleep and check response on follow-up.  If necessary routine SSRI therapy may be warranted for a short course. His updated medication list for this problem includes:    Trazodone Hcl 50 Mg Tabs (Trazodone hcl) .[name]... 1/2 to 1 tablet by mouth at bedtime as needed sleep.  Problem # 6:  HIV DISEASE (ICD-042) CD4 11/10/2010 was 100 @ 6%, HIV RNA 114,000 copies/ml @ 5.06 log with genotype pan-sensitive c/w WT virus.  Clinically symptomatic and ARV therapy definitely warranted.  Spent >20 minutes discussing HIV pathogenesis, mechanisms of ARV therapies, goals and rationale of treatment, and possible regimens along with medication SE's, toxicities, and medication scheduling/dosing guidelines.  Asked appropriate questions with full comprehension of information provided and answers to questions.  After discussion, we opted for Truvada and Isentress therapy as he was fearful of neurologic SE's efavirenz-based regimens.  A one-month supply was provided for him as well as a pillbox.  He was instructed to contact clinic for refills within 1 week of running out of meds.  He is also to continue dapsone for PCP prophylaxis.  Instructed to have  labs drawn in 4 weeks, and RTC in 6 weeks. His updated medication list for this problem includes:    Fluconazole 100 Mg Tabs (Fluconazole) .[name]... 1 tablet by mouth once daily x 5days  Orders: Est. Patient Level V (99215)Future Orders: T-CBC w/Diff ([id number]) ...  12/25/2010 T-CD4SP ([facility]) (CD4SP) ... 12/25/2010 T-Comprehensive Metabolic Panel [id number]) ... 12/25/2010 T-HIV Viral Load [id number]) ... 12/25/2010  Medications Added to Medication List This Visit: 1)  Isentress 400 Mg Tabs (Raltegravir potassium) .... Take one (1) tablet every twelve (12) hours 2)  Truvada 200-300 Mg Tabs (Emtricitabine-tenofovir) .... Take one (1) tablet once a day 3)  Fluconazole 100 Mg Tabs (Fluconazole) .[name].. 1 tablet by mouth once daily x 5days 4)  Trazodone Hcl 50 Mg Tabs (Trazodone hcl) .... 1/2 to 1 tablet by mouth at bedtime as needed sleep.  Other Orders: Bicillin LA up to 2.4 million units Injection (J0561) Bicillin CR 1.2 million units Injection (K4401) Admin of Therapeutic Inj  intramuscular or subcutaneous (02725)  Patient Instructions: 1)  Take 400-600mg  of Ibuprofen (Advil, Motrin) every 4-6 hours as needed for relief of pain. 2)  Take 1 Truvada tablet by mouth once daily. 3)  Take 1 Isentress 400mg  tablet by mouth every twelve hours. 4)  May take 1/2 to 1 tablet trazodone at bedtime as needed for sleep. 5)  Must return to clinic once weekly for the next two weeks for Penecillin G injections. 6)  Retrun to clinic in 4 weeks for repeat labs. 7)  Please schedule a follow-up visit for 6 weeks. Prescriptions: TRAZODONE HCL 50 MG TABS (TRAZODONE HCL) 1/2 to 1 tablet by mouth at bedtime as needed sleep.  #20 x 2   Entered and Authorized by:   Talmadge Chad NP   Signed by:   Talmadge Chad NP on 11/27/2010   Method used:   Print then Give to Patient   RxID:   [id number] FLUCONAZOLE 100 MG TABS (FLUCONAZOLE) 1 tablet by mouth once daily x 5days  #5 x 0   Entered and Authorized by:   Talmadge Chad NP   Signed by:   Talmadge Chad NP on 11/27/2010   Method used:   Print then Give to Patient   RxID:   [id number] TRUVADA 200-300 MG TABS (EMTRICITABINE-TENOFOVIR) Take one (1) tablet once a day  #30 x 5   Entered and  Authorized by:   Talmadge Chad NP   Signed by:   Talmadge Chad NP on 11/27/2010   Method used:   Print then Give to Patient   RxID:   [id number] ISENTRESS 400 MG TABS (RALTEGRAVIR POTASSIUM) Take one (1) tablet every twelve (12) hours  #60 x 5   Entered and Authorized by:   Talmadge Chad NP   Signed by:   Talmadge Chad NP on 11/27/2010   Method used:   Print then Give to Patient   RxID:   [id number]    Medication Administration  Injection # 1:    Medication: Bicillin LA 1.2 million units Injection    Diagnosis: AIDS (ICD-042)    Route: IM    Site: RUOQ gluteus    Exp Date: 04/2013    Lot #: [id number]    Mfr: king    Patient tolerated injection without complications    Given by: Starleen Arms CMA (November 27, 2010 12:14 PM)  Injection # 2:    Medication: Bicillin CR 1.2 million units Injection    Diagnosis: AIDS (ICD-042)    Route: IM  Site: LUOQ gluteus    Exp Date: 04/2013    Lot #: [id number]    Mfr: king    Patient tolerated injection without complications    Given by: Starleen Arms CMA (November 27, 2010 12:15 PM)  Orders Added: 1)  Est. Patient Level V [99215] 2)  T-RPR (Syphilis) [id number] 3)  T-CBC w/Diff [[id number]] 4)  T-CD4SP ([facility]) [CD4SP] 5)  T-Comprehensive Metabolic Panel [[id number]] 6)  T-HIV Viral Load [id number] 7)  Bicillin LA up to 2.4 million units Injection [J0561] 8)  Bicillin CR 1.2 million units Injection [J0558] 9)  Admin of Therapeutic Inj  intramuscular or subcutaneous [96372]     Medication Administration  Injection # 1:    Medication: Bicillin LA 1.2 million units Injection    Diagnosis: AIDS (ICD-042)    Route: IM    Site: RUOQ gluteus    Exp Date: 04/2013    Lot #: [id number]    Mfr: king    Patient tolerated injection without complications    Given by: Starleen Arms CMA (November 27, 2010 12:14 PM)  Injection # 2:    Medication: Bicillin CR 1.2 million units Injection     Diagnosis: AIDS (ICD-042)    Route: IM    Site: LUOQ gluteus    Exp Date: 04/2013    Lot #: [id number]    Mfr: king    Patient tolerated injection without complications    Given by: Starleen Arms CMA (November 27, 2010 12:15 PM)  Orders Added: 1)  Est. Patient Level V [99215] 2)  T-RPR (Syphilis) [[id number]] 3)  T-CBC w/Diff [[id number]] 4)  T-CD4SP ([facility]) [CD4SP] 5)  T-Comprehensive Metabolic Panel [[id number]] 6)  T-HIV Viral Load [id number] 7)  Bicillin LA up to 2.4 million units Injection [J0561] 8)  Bicillin CR 1.2 million units Injection [J0558] 9)  Admin of Therapeutic Inj  intramuscular or subcutaneous [96372]  Appended Document: hsfu/TY Isentress and Truvada called to [facility] program to be shipped to clinic

## 2011-01-21 NOTE — Assessment & Plan Note (Signed)
Summary: rash ear [mkj]   Primary Provider:  Talmadge Chad NP  CC:  rash on left ear.  History of Present Illness: Intermittent rash behind left ear over past week.  Occurs most often when he is laying down.  Rash described as welting in nature, pruritic, but not painful.  Also c/o dry itching skin to lower back area.  Resumed ARV's, but sleep pattern before resumi ng therapy has been poor.  Doesn't think trazodone is working.  Remains with persistent HA's of the same nature he had post LP 1 month back.   Current Allergies (reviewed today): ! BACTRIM Review of Systems       The patient complains of headaches.  The patient denies anorexia, fever, weight loss, weight gain, vision loss, decreased hearing, hoarseness, chest pain, syncope, dyspnea on exertion, peripheral edema, prolonged cough, hemoptysis, abdominal pain, melena, hematochezia, severe indigestion/heartburn, hematuria, incontinence, genital sores, muscle weakness, suspicious skin lesions, transient blindness, difficulty walking, depression, unusual weight change, abnormal bleeding, enlarged lymph nodes, angioedema, and testicular masses.         See HPI  Vital Signs:  Patient profile:   30 year old male Height:      77 inches (195.58 cm) Weight:      186.50 pounds (84.77 kg) BMI:     22.20 Temp:     97.8 degrees F (36.56 degrees C) oral Pulse rate:   88 / minute BP sitting:   130 / 83  (left arm)  Vitals Entered By: Starleen Arms CMA (December 25, 2010 9:52 AM) CC: rash on left ear Is Patient Diabetic? No Pain Assessment Patient in pain? no      Nutritional Status BMI of 19 -24 = normal  Does patient need assistance? Functional Status Self care Ambulation Normal   Physical Exam  General:  Well-developed,well-nourished,in no acute distress; alert,appropriate and cooperative throughout examination Head:  Normocephalic and atraumatic without obvious abnormalities. No apparent alopecia or balding. Eyes:   No corneal or conjunctival inflammation noted. EOMI. Perrla.  Vision grossly normal. Ears:  No identifiable rash, wheals, keratosis, or lesions behid either ear noted. Mouth:  Oral mucosa and oropharynx without lesions or exudates.  Teeth in good repair. Neck:  supple, no masses, no thyromegaly, no thyroid nodules or tenderness, no JVD, no HJR, no neck tenderness, (+) trapezoid stiffness and decreased ROM.   Lungs:  Normal respiratory effort, chest expands symmetrically. Lungs are clear to auscultation, no crackles or wheezes. Heart:  Normal rate and regular rhythm. S1 and S2 normal without gallop, murmur, click, rub or other extra sounds. Msk:  No deformity or scoliosis noted of thoracic or lumbar spine.   Extremities:  No clubbing, cyanosis, edema, or deformity noted with normal full range of motion of all joints.   Neurologic:  No cranial nerve deficits noted. Station and gait are normal.  Sensory, motor and coordinative functions appear intact. Skin:  Skin to back region xerotic with no active rashes or skin lesions noted. Psych:  Cognition and judgment appear intact. Alert and cooperative with normal attention span and concentration. No apparent delusions, illusions, hallucinations   Impression & Recommendations:  Problem # 1:  SKIN RASH (ICD-782.1)  Subjective only.  No kidentifiable rash or lesions noted on exam.  Instructed to come to clinic if a new rash to left pinna re-develops so appropriate diagnosis and treatment can be made.  Meanwhile we recommend determining what pillows or linens he is using while lying down and to use fragrance-free, dye-free detergents  and soaps when washing clothes and linens and while bathing.  Orders: Est. Patient Level IV (40981)  Problem # 2:  XERODERMA (ICD-701.1)  Dry skin care reviewed again: luke bath water, fragrance-free, dye-free detergents, liquid sensitive skin soaps to bathe using louffa to help exfoliate skin cells, eucerin cream/lotion or  equivalent twice daily.  Orders: Est. Patient Level IV (19147)  Problem # 3:  INSOMNIA (ICD-780.52)  Instructed to double dose of trazodone for sleep and check response.  If no improvement, short-course temazepam or low-dose lorazepam may be in order.  Will f/u on RTC 01/04/2011.  Orders: Est. Patient Level IV (82956)  Problem # 4:  REACTION TO SPINAL OR LUMBAR PUNCTURE (ICD-349.0)  Informed headaches if no different than previously can be a long-term event, and we will monitor this as time goes by.  If symptoms worsen or fail to improve, further neuro w/u may be warranted.  Orders: Est. Patient Level IV (21308)

## 2011-01-21 NOTE — Assessment & Plan Note (Signed)
Summary: shot [mkj]  Prior Medications: DAPSONE 100 MG TABS (DAPSONE) Take 1 tablet by mouth once a day ISENTRESS 400 MG TABS (RALTEGRAVIR POTASSIUM) Take one (1) tablet every twelve (12) hours TRUVADA 200-300 MG TABS (EMTRICITABINE-TENOFOVIR) Take one (1) tablet once a day TRAZODONE HCL 50 MG TABS (TRAZODONE HCL) 1/2 to 1 tablet by mouth at bedtime as needed sleep. Current Allergies: ! 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)    Route: IM    Site: LUOQ gluteus    Exp Date: 05/20/2013    Lot #: [id number]    Mfr: king    Patient tolerated injection without complications    Given by: Starleen Arms CMA (December 04, 2010 3:52 PM)  Orders Added: 1)  Bicillin CR 1.2 million units Injection [J0558] 2)  Bicillin CR 1.2 million units Injection [J0558] 3)  Admin of Therapeutic Inj  intramuscular or subcutaneous [96372]   Medication Administration  Injection # 1:    Medication: Bicillin CR 1.2 million units Injection    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)    Route: IM    Site: RUOQ gluteus    Exp Date: 05/20/2013    Lot #: [id number]    Mfr: king    Patient tolerated injection without complications    Given by: Starleen Arms CMA (December 04, 2010 3:51 PM)  Injection # 2:    Medication: Bicillin CR 1.2 million units Injection    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

## 2011-01-21 NOTE — Miscellaneous (Signed)
  Clinical Lists Changes 

## 2011-01-23 ENCOUNTER — Emergency Department (HOSPITAL_BASED_OUTPATIENT_CLINIC_OR_DEPARTMENT_OTHER)
Admission: EM | Admit: 2011-01-23 | Discharge: 2011-01-23 | Disposition: A | Discharge status: Home / Self Care | Payer: Self-pay | Attending: Emergency Medicine | Admitting: Emergency Medicine

## 2011-01-23 DIAGNOSIS — Z21 Asymptomatic human immunodeficiency virus [HIV] infection status: Secondary | ICD-10-CM | POA: Insufficient documentation

## 2011-01-23 DIAGNOSIS — R51 Headache: Secondary | ICD-10-CM | POA: Insufficient documentation

## 2011-01-28 ENCOUNTER — Encounter: Payer: Self-pay | Admitting: Adult Health

## 2011-02-01 ENCOUNTER — Ambulatory Visit (INDEPENDENT_AMBULATORY_CARE_PROVIDER_SITE_OTHER): Payer: Self-pay | Admitting: Adult Health

## 2011-02-01 ENCOUNTER — Ambulatory Visit: Payer: Self-pay | Admitting: Adult Health

## 2011-02-01 DIAGNOSIS — B20 Human immunodeficiency virus [HIV] disease: Secondary | ICD-10-CM

## 2011-02-01 DIAGNOSIS — R05 Cough: Secondary | ICD-10-CM

## 2011-02-25 NOTE — Miscellaneous (Signed)
Summary: [facility]  [place]   Imported By: Florinda Marker 02/17/2011 15:59:33  _____________________________________________________________________  External Attachment:    Type:   Image     Comment:   External Document

## 2011-03-02 LAB — COMPREHENSIVE METABOLIC PANEL
ALT: 18 U/L (ref 0–53)
AST: 24 U/L (ref 0–37)
Albumin: 3 g/dL — ABNORMAL LOW (ref 3.5–5.2)
Alkaline Phosphatase: 61 U/L (ref 39–117)
Alkaline Phosphatase: 66 U/L (ref 39–117)
BUN: 12 mg/dL (ref 6–23)
BUN: 10 mg/dL (ref 6–23)
CO2: 22 mEq/L (ref 19–32)
CO2: 24 mEq/L (ref 19–32)
Calcium: 8.7 mg/dL (ref 8.4–10.5)
Chloride: 103 mEq/L (ref 96–112)
Chloride: 95 mEq/L — ABNORMAL LOW (ref 96–112)
Creatinine, Ser: 0.82 mg/dL (ref 0.4–1.5)
Creatinine, Ser: 1.17 mg/dL (ref 0.4–1.5)
GFR calc Af Amer: >60 mL/min (ref >60)
GFR calc non Af Amer: >60 mL/min (ref >60)
GFR calc non Af Amer: >60 mL/min (ref >60)
Glucose, Bld: 140 mg/dL — ABNORMAL HIGH (ref 70–99)
Glucose, Bld: 91 mg/dL (ref 70–99)
Potassium: 4.1 mEq/L (ref 3.5–5.1)
Potassium: 3.6 mEq/L (ref 3.5–5.1)
Sodium: 135 mEq/L (ref 135–145)
Total Bilirubin: 0.5 mg/dL (ref 0.3–1.2)
Total Bilirubin: 1 mg/dL (ref 0.3–1.2)
Total Protein: 7.8 g/dL (ref 6.0–8.3)

## 2011-03-02 LAB — CBC
HCT: 32.4 % — ABNORMAL LOW (ref 39.0–52.0)
HCT: 33.3 % — ABNORMAL LOW (ref 39.0–52.0)
Hemoglobin: 10.5 g/dL — ABNORMAL LOW (ref 13.0–17.0)
Hemoglobin: 11 g/dL — ABNORMAL LOW (ref 13.0–17.0)
MCH: 26.4 pg (ref 26.0–34.0)
MCH: 26.8 pg (ref 26.0–34.0)
MCHC: 32.4 g/dL (ref 30.0–36.0)
MCV: 81.6 fL (ref 78.0–100.0)
MCV: 81.2 fL (ref 78.0–100.0)
Platelets: 297 10*3/uL (ref 150–400)
Platelets: 276 10*3/uL (ref 150–400)
RBC: 3.97 MIL/uL — ABNORMAL LOW (ref 4.22–5.81)
RBC: 4.1 MIL/uL — ABNORMAL LOW (ref 4.22–5.81)
RDW: 13.6 % (ref 11.5–15.5)
WBC: 4.8 10*3/uL (ref 4.0–10.5)

## 2011-03-02 LAB — AFB CULTURE, BLOOD

## 2011-03-02 LAB — DIFFERENTIAL
Basophils Absolute: 0 10*3/uL (ref 0.0–0.1)
Basophils Absolute: 0 10*3/uL (ref 0.0–0.1)
Basophils Relative: 0 % (ref 0–1)
Basophils Relative: 0 % (ref 0–1)
Eosinophils Absolute: 0 10*3/uL (ref 0.0–0.7)
Eosinophils Relative: 0 % (ref 0–5)
Lymphocytes Relative: 17 % (ref 12–46)
Lymphocytes Relative: 24 % (ref 12–46)
Lymphs Abs: 0.8 10*3/uL (ref 0.7–4.0)
Monocytes Absolute: 0.2 10*3/uL (ref 0.1–1.0)
Monocytes Relative: 4 % (ref 3–12)
Neutro Abs: 3.8 10*3/uL (ref 1.7–7.7)
Neutro Abs: 3.4 10*3/uL (ref 1.7–7.7)
Neutrophils Relative %: 79 % — ABNORMAL HIGH (ref 43–77)

## 2011-03-02 LAB — T-HELPER CELL (CD4) - (RCID CLINIC ONLY)
CD4 % Helper T Cell: 8 % — ABNORMAL LOW (ref 33–55)
CD4 T Cell Abs: 100 uL — ABNORMAL LOW (ref 400–2700)

## 2011-03-02 LAB — CULTURE, BLOOD (ROUTINE X 2)
Culture  Setup Time: 201112010325
Culture  Setup Time: 201112010325
Culture: NO GROWTH
Culture: NO GROWTH

## 2011-03-02 LAB — PROTIME-INR
INR: 1.12 (ref 0.00–1.49)
Prothrombin Time: 14.6 seconds (ref 11.6–15.2)

## 2011-03-02 LAB — CSF CELL COUNT WITH DIFFERENTIAL
RBC Count, CSF: 1 /mm3 — ABNORMAL HIGH
Tube #: 4
WBC, CSF: 2 /mm3 (ref 0–5)

## 2011-03-02 LAB — CSF CULTURE W GRAM STAIN

## 2011-03-02 LAB — CYTOMEGALOVIRUS PCR, QUALITATIVE

## 2011-03-02 LAB — TSH: TSH: 0.329 u[IU]/mL — ABNORMAL LOW (ref 0.350–4.500)

## 2011-03-02 LAB — GRAM STAIN

## 2011-03-02 LAB — RAPID URINE DRUG SCREEN, HOSP PERFORMED
Amphetamines: NOT DETECTED
Barbiturates: NOT DETECTED
Benzodiazepines: NOT DETECTED
Cocaine: NOT DETECTED
Opiates: POSITIVE — AB
Tetrahydrocannabinol: NOT DETECTED

## 2011-03-02 LAB — VDRL, CSF: VDRL Quant, CSF: NONREACTIVE

## 2011-03-02 LAB — PROTEIN, CSF: Total  Protein, CSF: 26 mg/dL (ref 15–45)

## 2011-03-02 LAB — T-HELPER CELLS (CD4) COUNT (NOT AT ARMC)
CD4 % Helper T Cell: 10 % — ABNORMAL LOW (ref 33–55)
CD4 T Cell Abs: 90 uL — ABNORMAL LOW (ref 400–2700)

## 2011-03-02 LAB — HSV(HERPES SMPLX VRS)ABS-I+II(IGG)-CSF

## 2011-03-02 LAB — APTT: aPTT: 54 seconds — ABNORMAL HIGH (ref 24–37)

## 2011-03-02 LAB — URINE CULTURE: Culture: NO GROWTH

## 2011-03-02 LAB — RPR TITER: RPR Titer: 1:128 {titer} — AB

## 2011-03-02 LAB — LIPASE, BLOOD: Lipase: 20 U/L (ref 11–59)

## 2011-03-02 LAB — CRYPTOCOCCAL ANTIGEN, CSF

## 2011-03-02 LAB — RPR: RPR Ser Ql: REACTIVE — AB

## 2011-03-02 LAB — MAGNESIUM: Magnesium: 2.1 mg/dL (ref 1.5–2.5)

## 2011-03-02 LAB — PHOSPHORUS: Phosphorus: 4.4 mg/dL (ref 2.3–4.6)

## 2011-03-02 LAB — GLUCOSE, CSF: Glucose, CSF: 55 mg/dL (ref 43–76)

## 2011-03-02 LAB — T.PALLIDUM AB, IGG: T pallidum Antibodies (TP-PA): >8 S/CO — ABNORMAL HIGH (ref <0.90)

## 2011-03-03 ENCOUNTER — Encounter: Payer: Self-pay | Admitting: Licensed Clinical Social Worker

## 2011-03-03 LAB — HEPATIC FUNCTION PANEL
ALT: 44 U/L (ref 0–53)
AST: 37 U/L (ref 0–37)
Bilirubin, Direct: 0.2 mg/dL (ref 0.0–0.3)
Indirect Bilirubin: 0.8 mg/dL (ref 0.3–0.9)
Total Bilirubin: 1 mg/dL (ref 0.3–1.2)

## 2011-03-03 LAB — URINALYSIS, ROUTINE W REFLEX MICROSCOPIC
Leukocytes, UA: NEGATIVE
Nitrite: NEGATIVE
Specific Gravity, Urine: 1.029 (ref 1.005–1.030)
Urobilinogen, UA: 4 mg/dL — ABNORMAL HIGH (ref 0.0–1.0)
pH: 6.5 (ref 5.0–8.0)

## 2011-03-03 LAB — BASIC METABOLIC PANEL
BUN: 9 mg/dL (ref 6–23)
CO2: 26 mEq/L (ref 19–32)
Chloride: 100 mEq/L (ref 96–112)
Creatinine, Ser: 1.24 mg/dL (ref 0.4–1.5)
GFR calc Af Amer: >60 mL/min (ref >60)
Glucose, Bld: 105 mg/dL — ABNORMAL HIGH (ref 70–99)

## 2011-03-03 LAB — CULTURE, BLOOD (ROUTINE X 2): Culture  Setup Time: 201110180509

## 2011-03-03 LAB — T-HELPER CELLS (CD4) COUNT (NOT AT ARMC): CD4 T Cell Abs: 130 uL — ABNORMAL LOW (ref 400–2700)

## 2011-03-03 LAB — CBC
MCH: 28.1 pg (ref 26.0–34.0)
MCHC: 33.5 g/dL (ref 30.0–36.0)
MCV: 83.8 fL (ref 78.0–100.0)
Platelets: 180 10*3/uL (ref 150–400)
RBC: 4.74 MIL/uL (ref 4.22–5.81)

## 2011-03-03 LAB — URINE MICROSCOPIC-ADD ON

## 2011-03-03 LAB — DIFFERENTIAL
Basophils Relative: 0 % (ref 0–1)
Eosinophils Absolute: 0 10*3/uL (ref 0.0–0.7)
Eosinophils Relative: 0 % (ref 0–5)
Lymphs Abs: 1.8 10*3/uL (ref 0.7–4.0)
Monocytes Relative: 6 % (ref 3–12)

## 2011-03-03 LAB — STREP A DNA PROBE

## 2011-03-03 LAB — HIV-1 RNA QUANT-NO REFLEX-BLD
HIV 1 RNA Quant: 312000 copies/mL — ABNORMAL HIGH (ref <20)
HIV-1 RNA Quant, Log: 5.49 {Log} — ABNORMAL HIGH (ref <1.30)

## 2011-03-03 LAB — CRYPTOCOCCAL ANTIGEN: Crypto Ag: NEGATIVE

## 2011-03-17 ENCOUNTER — Other Ambulatory Visit: Payer: Self-pay

## 2011-03-30 ENCOUNTER — Ambulatory Visit: Payer: Self-pay | Admitting: Adult Health

## 2011-04-05 ENCOUNTER — Ambulatory Visit (INDEPENDENT_AMBULATORY_CARE_PROVIDER_SITE_OTHER): Payer: Self-pay | Admitting: Adult Health

## 2011-04-05 ENCOUNTER — Encounter: Payer: Self-pay | Admitting: Adult Health

## 2011-04-05 VITALS — BP 121/79 | HR 82 | Temp 97.8°F | Ht 77.0 in | Wt 187.0 lb

## 2011-04-05 DIAGNOSIS — B20 Human immunodeficiency virus [HIV] disease: Secondary | ICD-10-CM

## 2011-04-05 NOTE — Progress Notes (Signed)
  Subjective:    Patient ID: Hayden Johns, male    DOB: [birth year], 30 y.o.   MRN: [id number]  HPI No recent labs.  Still with chronic upper chest cough.  Having chronic sinus drainage.  No fever, SOB or DOE.    Review of Systems  Constitutional: Negative for fever, chills, diaphoresis, activity change, appetite change, fatigue and unexpected weight change.  HENT: Positive for congestion, rhinorrhea, sneezing and postnasal drip. Negative for hearing loss, ear pain, nosebleeds, sore throat, facial swelling, drooling, mouth sores, trouble swallowing, neck pain, neck stiffness, dental problem, voice change, sinus pressure, tinnitus and ear discharge.   Eyes: Negative for photophobia, pain, discharge, redness, itching and visual disturbance.  Respiratory: Positive for cough. Negative for apnea, choking, chest tightness, shortness of breath, wheezing and stridor.   Cardiovascular: Negative.   Gastrointestinal: Negative.   Genitourinary: Negative.   Musculoskeletal: Negative.   Skin: Negative.   Neurological: Negative.   Hematological: Negative.   Psychiatric/Behavioral: Negative.        Objective:   Physical Exam  Constitutional: He is oriented to person, place, and time. He appears well-nourished.  HENT:  Head: Normocephalic and atraumatic.  Right Ear: External ear normal.  Left Ear: External ear normal.  Mouth/Throat: No oropharyngeal exudate.       (+) rhinorrhea (+) post nasal drainage  Eyes: Conjunctivae and EOM are normal. Pupils are equal, round, and reactive to light. Right eye exhibits no discharge. Left eye exhibits no discharge. No scleral icterus.  Neck: Normal range of motion. Neck supple.  Cardiovascular: Normal rate, regular rhythm, normal heart sounds and intact distal pulses.   Pulmonary/Chest: Effort normal and breath sounds normal. He has no wheezes.  Abdominal: Soft. Bowel sounds are normal.  Musculoskeletal: Normal range of motion.  Neurological: He is  alert and oriented to person, place, and time. No cranial nerve deficit. Coordination normal.  Skin: Skin is warm and dry.  Psychiatric: He has a normal mood and affect. His behavior is normal. Judgment and thought content normal.          Assessment & Plan:  HIV:  Needs staging labs today.  CBC, CMP, CD4, VL.  May call in 2 weeks for lab results.  Repeat labs in 10 weeks with f/u in 3 months.  Otherwise CPM.  Chronic cough with post nasal drainage:  Mostly reactive airway without bronchospasm.  Cetirizine 10 mg qd, Diphenhydramine 25 mg qhs.  If symptoms not improved, will try nasal steroids.  Would benefit most from montelukast, but is cost prohibitive for him.[name]

## 2011-04-06 LAB — CBC WITH DIFFERENTIAL/PLATELET
Basophils Absolute: 0 10*3/uL (ref 0.0–0.1)
Basophils Relative: 1 % (ref 0–1)
Eosinophils Absolute: 0.3 10*3/uL (ref 0.0–0.7)
Eosinophils Relative: 7 % — ABNORMAL HIGH (ref 0–5)
Lymphs Abs: 2 10*3/uL (ref 0.7–4.0)
MCH: 28.4 pg (ref 26.0–34.0)
MCV: 86.4 fL (ref 78.0–100.0)
Neutrophils Relative %: 26 % — ABNORMAL LOW (ref 43–77)
Platelets: 283 10*3/uL (ref 150–400)
RBC: 4.93 MIL/uL (ref 4.22–5.81)
RDW: 13.7 % (ref 11.5–15.5)
WBC: 3.6 10*3/uL — ABNORMAL LOW (ref 4.0–10.5)

## 2011-04-06 LAB — T-HELPER CELL (CD4) - (RCID CLINIC ONLY)
CD4 % Helper T Cell: 10 % — ABNORMAL LOW (ref 33–55)
CD4 T Cell Abs: 210 uL — ABNORMAL LOW (ref 400–2700)

## 2011-04-06 LAB — COMPLETE METABOLIC PANEL WITH GFR
ALT: 11 U/L (ref 0–53)
AST: 21 U/L (ref 0–37)
Alkaline Phosphatase: 78 U/L (ref 39–117)
Creat: 0.96 mg/dL (ref 0.40–1.50)
GFR, Est African American: >60 mL/min (ref >60)
Sodium: 140 mEq/L (ref 135–145)
Total Bilirubin: 0.4 mg/dL (ref 0.3–1.2)
Total Protein: 8.1 g/dL (ref 6.0–8.3)

## 2011-04-06 LAB — HIV-1 RNA QUANT-NO REFLEX-BLD: HIV 1 RNA Quant: 1230 copies/mL — ABNORMAL HIGH (ref <20)

## 2011-04-07 ENCOUNTER — Ambulatory Visit: Payer: Self-pay

## 2011-04-08 NOTE — Progress Notes (Signed)
Addended by: Talmadge Chad on: 04/08/2011 08:48 AM   Modules accepted: Orders

## 2011-04-15 LAB — HIV-1 GENOTYPR PLUS

## 2011-04-21 ENCOUNTER — Telehealth: Payer: Self-pay | Admitting: *Deleted

## 2011-04-21 NOTE — Telephone Encounter (Signed)
Called patient to inform him that his viral load is going up and that his labs showed that he may have some resistance to the medications he is currently taking. Adv him to make a follow up appt so that he and provider can talk about making some changes to his regimen that may help with is numbers. He said he would and adv him sooner rather than later.

## 2011-05-05 ENCOUNTER — Encounter: Payer: Self-pay | Admitting: Adult Health

## 2011-05-05 ENCOUNTER — Ambulatory Visit (INDEPENDENT_AMBULATORY_CARE_PROVIDER_SITE_OTHER): Payer: Self-pay | Admitting: Adult Health

## 2011-05-05 VITALS — BP 124/83 | HR 97 | Temp 98.1°F | Ht 77.0 in | Wt 190.2 lb

## 2011-05-05 DIAGNOSIS — Z9119 Patient's noncompliance with other medical treatment and regimen: Secondary | ICD-10-CM

## 2011-05-05 DIAGNOSIS — Z91199 Patient's noncompliance with other medical treatment and regimen due to unspecified reason: Secondary | ICD-10-CM

## 2011-05-05 DIAGNOSIS — B20 Human immunodeficiency virus [HIV] disease: Secondary | ICD-10-CM

## 2011-05-05 MED ORDER — NEVIRAPINE 200 MG PO TABS: 200.0000 mg | ORAL_TABLET | Freq: Every day | ORAL | Status: DC

## 2011-05-05 NOTE — Progress Notes (Signed)
  Subjective:    Patient ID: Hayden Johns, male    DOB: [birth year], 30 y.o.   MRN: [id number]  HPI Presented to clinic upon request as a result of labs that were obtained in 04/05/2011, which showed resistance to one of his medications. Although he claims adherence to his medications, further assessment finds that he has had multiple missed doses, with sporadic adherence to medications. Recent for missing treatment doses includes that he either forgets or becomes too busy to remember to take the medicine. Denies any new complaints since his last visit.   Review of Systems  Constitutional: Negative.   HENT: Negative.   Eyes: Negative.   Respiratory: Negative.   Cardiovascular: Negative.   Gastrointestinal: Negative.   Genitourinary: Negative.   Musculoskeletal: Negative.   Skin: Negative.   Neurological: Negative.   Hematological: Negative.   Psychiatric/Behavioral: Negative.        Objective:   Physical Exam  Constitutional: He is oriented to person, place, and time. He appears well-developed and well-nourished.  HENT:  Head: Normocephalic and atraumatic.  Mouth/Throat: Oropharynx is clear and moist.  Eyes: Conjunctivae and EOM are normal. Pupils are equal, round, and reactive to light.  Neck: Normal range of motion. Neck supple.  Cardiovascular: Normal rate and regular rhythm.   Pulmonary/Chest: Effort normal and breath sounds normal.  Abdominal: Soft. Bowel sounds are normal.  Musculoskeletal: Normal range of motion.  Neurological: He is alert and oriented to person, place, and time. No cranial nerve deficit.  Skin: Skin is warm and dry.  Psychiatric: He has a normal mood and affect. His behavior is normal. Judgment and thought content normal.          Assessment & Plan:  1. HIV. 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 We will continue the current medications for now, and add lead-in dosing of  Viramune 200 mg by mouth daily for the next 2 weeks. He will return to clinic for followup at which time. If he is stable and tolerance to the regimen, we will begin Viramune XR 400 mg by mouth daily. Drug effects, side effects, potential adverse drug reactions, and toxicities were discussed in detail. He did relate that he is having problems with his ADAP for which he may have issues obtaining. His medications. We encouraged him to handle this problem as he has been on this plan before and should be resolved. Shortly. Once his ADAC has been approved. He can pick up the new prescription and start his medications. He verbally acknowledged all of this information and agreed with plan, followup with eligibility, and followup in clinic.  2. Personal History of Noncompliance. We reviewed in detail resistance development with HIV therapies, and informed him of the risks involved with sporadic adherence to treatment. We offered him a new pill box for which he declined, and he stated he would take medications as prescribed without any missed doses. We will followup with this on his next visit.

## 2011-05-06 ENCOUNTER — Ambulatory Visit: Payer: Self-pay

## 2011-05-19 ENCOUNTER — Ambulatory Visit: Payer: Self-pay | Admitting: Adult Health

## 2011-05-31 ENCOUNTER — Other Ambulatory Visit: Payer: Self-pay | Admitting: *Deleted

## 2011-05-31 DIAGNOSIS — B20 Human immunodeficiency virus [HIV] disease: Secondary | ICD-10-CM

## 2011-05-31 MED ORDER — NEVIRAPINE 200 MG PO TABS: 200.0000 mg | ORAL_TABLET | Freq: Two times a day (BID) | ORAL | Status: DC

## 2011-05-31 NOTE — Telephone Encounter (Signed)
Pt. Obtained refill for Viramune 200mg  qd #15 on 05/15/11.  He states that his last dose will be tomorrow, 06/01/11.  He missed his follow-up appt with B. Sundra Aland, NP.  RN reviewed that last office note.  Called the [facility] [address] and provided #60 tablets due to pt compliance with the medication.   Pt has return appt for 06/04/11 w/ B. Sundra Aland, NP.  Jennet Maduro, RN.

## 2011-06-04 ENCOUNTER — Ambulatory Visit: Payer: Self-pay | Admitting: Adult Health

## 2011-06-09 ENCOUNTER — Ambulatory Visit: Payer: Self-pay | Admitting: Internal Medicine

## 2011-06-17 ENCOUNTER — Ambulatory Visit: Payer: Self-pay

## 2011-06-17 ENCOUNTER — Encounter: Payer: Self-pay | Admitting: Adult Health

## 2011-06-17 ENCOUNTER — Other Ambulatory Visit: Payer: Self-pay | Admitting: Infectious Diseases

## 2011-06-17 ENCOUNTER — Ambulatory Visit (INDEPENDENT_AMBULATORY_CARE_PROVIDER_SITE_OTHER): Payer: Self-pay | Admitting: Adult Health

## 2011-06-17 VITALS — BP 136/79 | HR 93 | Temp 97.9°F | Ht 77.0 in | Wt 195.0 lb

## 2011-06-17 DIAGNOSIS — B20 Human immunodeficiency virus [HIV] disease: Secondary | ICD-10-CM

## 2011-06-17 LAB — CBC WITH DIFFERENTIAL/PLATELET
Basophils Absolute: 0 10*3/uL (ref 0.0–0.1)
Basophils Relative: 1 % (ref 0–1)
HCT: 42 % (ref 39.0–52.0)
Lymphocytes Relative: 47 % — ABNORMAL HIGH (ref 12–46)
MCHC: 32.1 g/dL (ref 30.0–36.0)
Monocytes Absolute: 0.3 10*3/uL (ref 0.1–1.0)
Neutro Abs: 1.8 10*3/uL (ref 1.7–7.7)
Platelets: 277 10*3/uL (ref 150–400)
RDW: 15.6 % — ABNORMAL HIGH (ref 11.5–15.5)
WBC: 4.4 10*3/uL (ref 4.0–10.5)

## 2011-06-17 LAB — COMPREHENSIVE METABOLIC PANEL
ALT: 13 U/L (ref 0–53)
AST: 22 U/L (ref 0–37)
Albumin: 4.7 g/dL (ref 3.5–5.2)
Alkaline Phosphatase: 98 U/L (ref 39–117)
BUN: 13 mg/dL (ref 6–23)
Creat: 0.97 mg/dL (ref 0.50–1.35)
Potassium: 4.4 mEq/L (ref 3.5–5.3)

## 2011-06-17 NOTE — Progress Notes (Signed)
  Subjective:    Patient ID: Hayden Johns, male    DOB: [birth year], 30 y.o.   MRN: [id number]  HPI Presents for followup after induction, with new antiretroviral regimen. States he occasionally misses one or 2 doses of his medications per week, usually on the weekend. But claims good tolerance with medications with no complications. He also relates that since he has started chemotherapy. Therapy. He has actually increased. His energy level significantly. He otherwise has voiced no new complaints.   Review of Systems  Constitutional: Negative.   HENT: Negative.   Eyes: Negative.   Respiratory: Positive for cough.        Cough is described as chronic and unchanged since previous reports.  Cardiovascular: Negative.   Gastrointestinal: Negative.   Genitourinary: Negative.   Musculoskeletal: Negative.   Skin: Negative.   Neurological: Negative.   Hematological: Negative.   Psychiatric/Behavioral: Negative.        Objective:   Physical Exam  Constitutional: He is oriented to person, place, and time. He appears well-developed and well-nourished. No distress.  HENT:  Head: Normocephalic and atraumatic.  Nose: Nose normal.  Eyes: Conjunctivae and EOM are normal. Pupils are equal, round, and reactive to light.  Neck: Normal range of motion. Neck supple.  Cardiovascular: Normal rate and regular rhythm.   Pulmonary/Chest: Effort normal and breath sounds normal.  Abdominal: Soft.  Musculoskeletal: Normal range of motion.  Neurological: He is alert and oriented to person, place, and time. He has normal reflexes.  Skin: Skin is warm.  Psychiatric: He has a normal mood and affect. His behavior is normal. Judgment and thought content normal.          Assessment & Plan:  1. HIV. As we have no new labs to evaluate, we will send him to lab for routine staging. If his viral load is larger than previously or not improved, we will repeat his genotype. We spent considerable time  discussing his issues with adherence in the cause and effect relationship between adherence and drug resistance. Measures to improve missed doses were also addressed. We will have him return to clinic in 3 weeks for followup with his labs at which time we may be better able to determine if the regimen. He currently is on his appropriate.  He verbally acknowledged all information that was provided to him and agreed with plan of care.

## 2011-06-18 LAB — T-HELPER CELL (CD4) - (RCID CLINIC ONLY)
CD4 % Helper T Cell: 12 % — ABNORMAL LOW (ref 33–55)
CD4 T Cell Abs: 280 uL — ABNORMAL LOW (ref 400–2700)

## 2011-07-08 ENCOUNTER — Ambulatory Visit (INDEPENDENT_AMBULATORY_CARE_PROVIDER_SITE_OTHER): Payer: Self-pay | Admitting: Adult Health

## 2011-07-08 ENCOUNTER — Encounter: Payer: Self-pay | Admitting: Adult Health

## 2011-07-08 VITALS — BP 150/78 | HR 94 | Temp 98.0°F | Ht 77.0 in | Wt 194.0 lb

## 2011-07-08 DIAGNOSIS — B354 Tinea corporis: Secondary | ICD-10-CM | POA: Insufficient documentation

## 2011-07-08 MED ORDER — NYSTATIN 100000 UNIT/GM EX CREA: TOPICAL_CREAM | Freq: Two times a day (BID) | CUTANEOUS | Status: DC

## 2011-07-08 NOTE — Progress Notes (Signed)
  Subjective:    Patient ID: Hayden Johns, male    DOB: Sep 17, 1981, 30 y.o.   MRN: [id number]  HPI Doing relatively well. Endorses adherence to his medications with no complications and good tolerance. Aside from chronic cough, that he has had for a long time his only other complaint is a "rash" on the back of his neck. Denies pruritus, pain, burning or stinging to the area.   Review of Systems  Constitutional: Negative.   HENT: Negative.   Eyes: Negative.   Respiratory: Positive for cough. Negative for shortness of breath and wheezing.   Cardiovascular: Negative.   Gastrointestinal: Negative.   Genitourinary: Negative.   Musculoskeletal: Negative.   Skin: Positive for rash.  Neurological: Negative.   Hematological: Negative.   Psychiatric/Behavioral: Negative.        Objective:   Physical Exam  Constitutional: He is oriented to person, place, and time. He appears well-developed and well-nourished. No distress.  HENT:  Head: Normocephalic and atraumatic.  Right Ear: External ear normal.  Left Ear: External ear normal.  Nose: Nose normal.  Mouth/Throat: Oropharynx is clear and moist.  Eyes: Conjunctivae and EOM are normal. Pupils are equal, round, and reactive to light.  Neck: Normal range of motion. Neck supple.  Cardiovascular: Normal rate, regular rhythm, normal heart sounds and intact distal pulses.   Pulmonary/Chest: Effort normal and breath sounds normal. He has no wheezes. He has no rales.  Abdominal: Soft.  Musculoskeletal: Normal range of motion.  Neurological: He is alert and oriented to person, place, and time. He has normal reflexes.  Skin: Skin is warm and dry. Rash noted.       Blotchy hypopigmented, rash noted to the posterior neck region.  Psychiatric: He has a normal mood and affect. His behavior is normal. Judgment and thought content normal.          Assessment & Plan:   1. HIV. Labs obtained 06/17/2011. Show a CD4 count of 280 at 12% with a  viral load of less than 20 copies per mL. Clinically stable on current regimen. Recommend continuing present management, repeating labs in 10 weeks with a followup in 3 months.  2. Chronic Cough. Workup in the past is been negative. So far. We will continue to monitor this and hopefully, when he obtains insurance to refer him either to ENT specialist. Pulmonary medicine.  3. Tinea Infection to the Posterior Neck Region. Nystatin cream to area twice a day.  He verbally acknowledged all information was provided to him and agreed with plan of care.

## 2011-09-27 ENCOUNTER — Other Ambulatory Visit: Payer: Self-pay | Admitting: Adult Health

## 2011-09-27 ENCOUNTER — Other Ambulatory Visit: Payer: Self-pay

## 2011-09-27 DIAGNOSIS — B20 Human immunodeficiency virus [HIV] disease: Secondary | ICD-10-CM

## 2011-10-05 ENCOUNTER — Other Ambulatory Visit: Payer: Self-pay

## 2011-10-05 ENCOUNTER — Other Ambulatory Visit: Payer: Self-pay | Admitting: Internal Medicine

## 2011-10-05 DIAGNOSIS — B20 Human immunodeficiency virus [HIV] disease: Secondary | ICD-10-CM

## 2011-10-06 LAB — COMPREHENSIVE METABOLIC PANEL
ALT: 23 U/L (ref 0–53)
AST: 34 U/L (ref 0–37)
Albumin: 4.6 g/dL (ref 3.5–5.2)
Calcium: 9.1 mg/dL (ref 8.4–10.5)
Chloride: 104 mEq/L (ref 96–112)
Creat: 1.01 mg/dL (ref 0.50–1.35)
Potassium: 3.9 mEq/L (ref 3.5–5.3)
Sodium: 138 mEq/L (ref 135–145)
Total Protein: 7.7 g/dL (ref 6.0–8.3)

## 2011-10-06 LAB — CBC WITH DIFFERENTIAL/PLATELET
HCT: 41.6 % (ref 39.0–52.0)
Hemoglobin: 14 g/dL (ref 13.0–17.0)
Lymphocytes Relative: 54 % — ABNORMAL HIGH (ref 12–46)
Lymphs Abs: 2.5 10*3/uL (ref 0.7–4.0)
Monocytes Relative: 6 % (ref 3–12)
Neutro Abs: 1.6 10*3/uL — ABNORMAL LOW (ref 1.7–7.7)
Neutrophils Relative %: 35 % — ABNORMAL LOW (ref 43–77)
RBC: 4.77 MIL/uL (ref 4.22–5.81)

## 2011-10-07 LAB — HIV-1 RNA QUANT-NO REFLEX-BLD
HIV 1 RNA Quant: <20 copies/mL (ref <20)
HIV-1 RNA Quant, Log: <1.3 {Log} (ref <1.30)

## 2011-10-11 ENCOUNTER — Ambulatory Visit: Payer: Self-pay | Admitting: Infectious Disease

## 2011-10-12 ENCOUNTER — Ambulatory Visit (INDEPENDENT_AMBULATORY_CARE_PROVIDER_SITE_OTHER): Payer: Self-pay | Admitting: Infectious Disease

## 2011-10-12 ENCOUNTER — Encounter: Payer: Self-pay | Admitting: Infectious Disease

## 2011-10-12 VITALS — BP 139/83 | HR 90 | Temp 98.1°F | Wt 199.0 lb

## 2011-10-12 DIAGNOSIS — R05 Cough: Secondary | ICD-10-CM

## 2011-10-12 DIAGNOSIS — F449 Dissociative and conversion disorder, unspecified: Secondary | ICD-10-CM

## 2011-10-12 DIAGNOSIS — B20 Human immunodeficiency virus [HIV] disease: Secondary | ICD-10-CM

## 2011-10-12 DIAGNOSIS — Z23 Encounter for immunization: Secondary | ICD-10-CM

## 2011-10-12 DIAGNOSIS — R0989 Other specified symptoms and signs involving the circulatory and respiratory systems: Secondary | ICD-10-CM

## 2011-10-12 DIAGNOSIS — F458 Other somatoform disorders: Secondary | ICD-10-CM

## 2011-10-12 MED ORDER — OMEPRAZOLE MAGNESIUM 20 MG PO TBEC: 20.0000 mg | DELAYED_RELEASE_TABLET | Freq: Every day | ORAL | Status: DC

## 2011-10-12 NOTE — Progress Notes (Signed)
  Subjective:    Patient ID: Hayden Johns, male    DOB: 1981/12/13, 30 y.o.   MRN: [id number]  HPI  Brayam Boeke is a 30 y.o. male African american who is doing superbly well on his antiviral regimen of twice daily isentress, viramune and once daily truvada, with undetectable viral load and health cd4 count.  He does continue to suffer some globus sensation which has not improved with therapy with Ativan. Also is coughing quickly as well and suffering what sounds like acid reflux symptoms.  Review of Systems  Constitutional: Negative for fever, chills, diaphoresis, activity change, appetite change, fatigue and unexpected weight change.  HENT: Positive for trouble swallowing. Negative for congestion, sore throat, rhinorrhea, sneezing and sinus pressure.   Eyes: Negative for photophobia and visual disturbance.  Respiratory: Positive for cough. Negative for chest tightness, shortness of breath, wheezing and stridor.   Cardiovascular: Negative for chest pain, palpitations and leg swelling.  Gastrointestinal: Negative for nausea, vomiting, abdominal pain, diarrhea, constipation, blood in stool, abdominal distention and anal bleeding.  Genitourinary: Negative for dysuria, hematuria, flank pain and difficulty urinating.  Musculoskeletal: Negative for myalgias, back pain, joint swelling, arthralgias and gait problem.  Skin: Negative for color change, pallor, rash and wound.  Neurological: Negative for dizziness, tremors, weakness and light-headedness.  Hematological: Negative for adenopathy. Does not bruise/bleed easily.  Psychiatric/Behavioral: Negative for behavioral problems, confusion, sleep disturbance, dysphoric mood, decreased concentration and agitation.       Objective:   Physical Exam  Constitutional: He is oriented to person, place, and time. He appears well-developed and well-nourished. No distress.  HENT:  Head: Normocephalic and atraumatic.  Mouth/Throat: Oropharynx  is clear and moist. No oropharyngeal exudate.  Eyes: Conjunctivae and EOM are normal. Pupils are equal, round, and reactive to light. No scleral icterus.  Neck: Normal range of motion. Neck supple. No JVD present.  Cardiovascular: Normal rate, regular rhythm and normal heart sounds.  Exam reveals no gallop and no friction rub.   No murmur heard. Pulmonary/Chest: Effort normal and breath sounds normal. No respiratory distress. He has no wheezes. He has no rales. He exhibits no tenderness.  Abdominal: He exhibits no distension and no mass. There is no tenderness. There is no rebound and no guarding.  Musculoskeletal: He exhibits no edema and no tenderness.  Lymphadenopathy:    He has no cervical adenopathy.  Neurological: He is alert and oriented to person, place, and time. He has normal reflexes. He exhibits normal muscle tone. Coordination normal.  Skin: Skin is warm and dry. He is not diaphoretic. No erythema. No pallor.  Psychiatric: He has a normal mood and affect. His behavior is normal. Judgment and thought content normal.          Assessment & Plan:  Human immunodeficiency virus (HIV) disease Superb control continue current regimen  Globus sensation Try proton pump inhibitor in case this is acid reflux playing a role  COUGH, NON-PRODUCTIVE Again wonder about acid reflux.

## 2011-10-12 NOTE — Assessment & Plan Note (Signed)
Try proton pump inhibitor in case this is acid reflux playing a role

## 2011-10-12 NOTE — Assessment & Plan Note (Signed)
Again wonder about acid reflux.

## 2011-10-12 NOTE — Assessment & Plan Note (Signed)
Superb control continue current regimen

## 2011-12-13 ENCOUNTER — Other Ambulatory Visit: Payer: Self-pay | Admitting: *Deleted

## 2011-12-13 DIAGNOSIS — B20 Human immunodeficiency virus [HIV] disease: Secondary | ICD-10-CM

## 2011-12-13 MED ORDER — EMTRICITABINE-TENOFOVIR DF 200-300 MG PO TABS: 1.0000 | ORAL_TABLET | Freq: Every day | ORAL | Status: DC

## 2011-12-13 MED ORDER — RALTEGRAVIR POTASSIUM 400 MG PO TABS: 400.0000 mg | ORAL_TABLET | Freq: Two times a day (BID) | ORAL | Status: DC

## 2011-12-13 MED ORDER — NEVIRAPINE 200 MG PO TABS: 200.0000 mg | ORAL_TABLET | Freq: Two times a day (BID) | ORAL | Status: DC

## 2012-01-31 ENCOUNTER — Other Ambulatory Visit: Payer: Self-pay | Admitting: *Deleted

## 2012-01-31 DIAGNOSIS — B20 Human immunodeficiency virus [HIV] disease: Secondary | ICD-10-CM

## 2012-01-31 MED ORDER — NEVIRAPINE 200 MG PO TABS: 200.0000 mg | ORAL_TABLET | Freq: Two times a day (BID) | ORAL | Status: DC

## 2012-02-01 ENCOUNTER — Other Ambulatory Visit: Payer: Self-pay | Admitting: *Deleted

## 2012-02-01 DIAGNOSIS — B20 Human immunodeficiency virus [HIV] disease: Secondary | ICD-10-CM

## 2012-02-01 MED ORDER — NEVIRAPINE 200 MG PO TABS: 200.0000 mg | ORAL_TABLET | Freq: Two times a day (BID) | ORAL | Status: DC

## 2012-02-01 MED ORDER — EMTRICITABINE-TENOFOVIR DF 200-300 MG PO TABS: 1.0000 | ORAL_TABLET | Freq: Every day | ORAL | Status: DC

## 2012-02-01 MED ORDER — RALTEGRAVIR POTASSIUM 400 MG PO TABS: 400.0000 mg | ORAL_TABLET | Freq: Two times a day (BID) | ORAL | Status: DC

## 2012-02-21 ENCOUNTER — Other Ambulatory Visit: Payer: Self-pay

## 2012-02-21 ENCOUNTER — Ambulatory Visit: Payer: Self-pay

## 2012-02-23 ENCOUNTER — Other Ambulatory Visit: Payer: Self-pay

## 2012-02-23 ENCOUNTER — Ambulatory Visit: Payer: Self-pay

## 2012-02-23 ENCOUNTER — Other Ambulatory Visit: Payer: Self-pay | Admitting: Infectious Disease

## 2012-02-23 DIAGNOSIS — Z79899 Other long term (current) drug therapy: Secondary | ICD-10-CM

## 2012-02-29 ENCOUNTER — Other Ambulatory Visit (INDEPENDENT_AMBULATORY_CARE_PROVIDER_SITE_OTHER): Payer: Self-pay

## 2012-02-29 DIAGNOSIS — Z113 Encounter for screening for infections with a predominantly sexual mode of transmission: Secondary | ICD-10-CM

## 2012-02-29 DIAGNOSIS — B20 Human immunodeficiency virus [HIV] disease: Secondary | ICD-10-CM

## 2012-02-29 DIAGNOSIS — Z79899 Other long term (current) drug therapy: Secondary | ICD-10-CM

## 2012-02-29 LAB — RPR

## 2012-02-29 LAB — CBC WITH DIFFERENTIAL/PLATELET
Basophils Absolute: 0 10*3/uL (ref 0.0–0.1)
Basophils Relative: 1 % (ref 0–1)
Eosinophils Absolute: 0.4 10*3/uL (ref 0.0–0.7)
MCH: 29.7 pg (ref 26.0–34.0)
MCHC: 34.3 g/dL (ref 30.0–36.0)
Neutrophils Relative %: 28 % — ABNORMAL LOW (ref 43–77)
Platelets: 242 10*3/uL (ref 150–400)
RDW: 12.9 % (ref 11.5–15.5)

## 2012-02-29 LAB — COMPLETE METABOLIC PANEL WITH GFR
ALT: 21 U/L (ref 0–53)
CO2: 25 mEq/L (ref 19–32)
Calcium: 9.3 mg/dL (ref 8.4–10.5)
Chloride: 104 mEq/L (ref 96–112)
GFR, Est African American: >89 mL/min
Glucose, Bld: 97 mg/dL (ref 70–99)
Sodium: 140 mEq/L (ref 135–145)
Total Protein: 7.9 g/dL (ref 6.0–8.3)

## 2012-02-29 LAB — LIPID PANEL: Total CHOL/HDL Ratio: 2.7 Ratio

## 2012-03-01 LAB — GC/CHLAMYDIA PROBE AMP, URINE
Chlamydia, Swab/Urine, PCR: NEGATIVE
GC Probe Amp, Urine: NEGATIVE

## 2012-03-01 LAB — T-HELPER CELL (CD4) - (RCID CLINIC ONLY): CD4 % Helper T Cell: 15 % — ABNORMAL LOW (ref 33–55)

## 2012-03-02 LAB — HIV-1 RNA QUANT-NO REFLEX-BLD: HIV-1 RNA Quant, Log: <1.3 {Log} (ref <1.30)

## 2012-03-06 ENCOUNTER — Ambulatory Visit: Payer: Self-pay | Admitting: Infectious Disease

## 2012-03-07 ENCOUNTER — Ambulatory Visit: Payer: Self-pay

## 2012-03-14 ENCOUNTER — Encounter: Payer: Self-pay | Admitting: Infectious Disease

## 2012-03-14 ENCOUNTER — Ambulatory Visit (INDEPENDENT_AMBULATORY_CARE_PROVIDER_SITE_OTHER): Payer: Self-pay | Admitting: Infectious Disease

## 2012-03-14 ENCOUNTER — Other Ambulatory Visit: Payer: Self-pay | Admitting: *Deleted

## 2012-03-14 VITALS — BP 127/83 | HR 80 | Temp 98.0°F | Ht 77.0 in | Wt 204.0 lb

## 2012-03-14 DIAGNOSIS — R0989 Other specified symptoms and signs involving the circulatory and respiratory systems: Secondary | ICD-10-CM

## 2012-03-14 DIAGNOSIS — F458 Other somatoform disorders: Secondary | ICD-10-CM

## 2012-03-14 DIAGNOSIS — B20 Human immunodeficiency virus [HIV] disease: Secondary | ICD-10-CM

## 2012-03-14 DIAGNOSIS — F17200 Nicotine dependence, unspecified, uncomplicated: Secondary | ICD-10-CM

## 2012-03-14 DIAGNOSIS — F329 Major depressive disorder, single episode, unspecified: Secondary | ICD-10-CM

## 2012-03-14 MED ORDER — BUPROPION HCL ER (XL) 150 MG PO TB24: ORAL_TABLET | ORAL | Status: DC

## 2012-03-14 MED ORDER — OMEPRAZOLE MAGNESIUM 20 MG PO TBEC: 20.0000 mg | DELAYED_RELEASE_TABLET | Freq: Every day | ORAL | Status: DC

## 2012-03-14 MED ORDER — NEVIRAPINE ER 400 MG PO TB24: 400.0000 mg | ORAL_TABLET | Freq: Every day | ORAL | Status: DC

## 2012-03-14 MED ORDER — RALTEGRAVIR POTASSIUM 400 MG PO TABS: 400.0000 mg | ORAL_TABLET | Freq: Two times a day (BID) | ORAL | Status: DC

## 2012-03-14 NOTE — Assessment & Plan Note (Signed)
Start wellbutrin to rx this and smoking aide too

## 2012-03-14 NOTE — Assessment & Plan Note (Signed)
Continue bid isentress, truvada and now XR viramune

## 2012-03-14 NOTE — Assessment & Plan Note (Signed)
Start wellbutrin 

## 2012-03-14 NOTE — Telephone Encounter (Signed)
Resent Prilosec to pharmacy.  Not previously sent electronically.

## 2012-03-14 NOTE — Progress Notes (Signed)
  Subjective:    Patient ID: Hayden Johns, male    DOB: 01/28/1981, 31 y.o.   MRN: [id number]  HPI  Hayden Johns is a 31 y.o. male African american who is doing superbly well on his antiviral regimen of twice daily isentress, viramune and once daily truvada, with undetectable viral load and health cd4 count. His cough , possibliy  Due to GERD is better with PPI. He is interested in SSRI for rx anxiety and is also wanting to stop smoking.   Review of Systems  Constitutional: Negative for fever, chills, diaphoresis, activity change, appetite change, fatigue and unexpected weight change.  HENT: Negative for congestion, sore throat, rhinorrhea, sneezing, trouble swallowing and sinus pressure.   Eyes: Negative for photophobia and visual disturbance.  Respiratory: Negative for cough, chest tightness, shortness of breath, wheezing and stridor.   Cardiovascular: Negative for chest pain, palpitations and leg swelling.  Gastrointestinal: Negative for nausea, vomiting, abdominal pain, diarrhea, constipation, blood in stool, abdominal distention and anal bleeding.  Genitourinary: Negative for dysuria, hematuria, flank pain and difficulty urinating.  Musculoskeletal: Negative for myalgias, back pain, joint swelling, arthralgias and gait problem.  Skin: Negative for color change, pallor, rash and wound.  Neurological: Negative for dizziness, tremors, weakness and light-headedness.  Hematological: Negative for adenopathy. Does not bruise/bleed easily.  Psychiatric/Behavioral: Negative for suicidal ideas, behavioral problems, confusion, sleep disturbance, self-injury, dysphoric mood, decreased concentration and agitation. The patient is nervous/anxious.        Objective:   Physical Exam  Constitutional: He is oriented to person, place, and time. He appears well-developed and well-nourished. No distress.  HENT:  Head: Normocephalic and atraumatic.  Mouth/Throat: Oropharynx is clear and  moist. No oropharyngeal exudate.  Eyes: Conjunctivae and EOM are normal. Pupils are equal, round, and reactive to light. No scleral icterus.  Neck: Normal range of motion. Neck supple. No JVD present.  Cardiovascular: Normal rate, regular rhythm and normal heart sounds.  Exam reveals no gallop and no friction rub.   No murmur heard. Pulmonary/Chest: Effort normal and breath sounds normal. No respiratory distress. He has no wheezes. He has no rales. He exhibits no tenderness.  Abdominal: He exhibits no distension and no mass. There is no tenderness. There is no rebound and no guarding.  Musculoskeletal: He exhibits no edema and no tenderness.  Lymphadenopathy:    He has no cervical adenopathy.  Neurological: He is alert and oriented to person, place, and time. He has normal reflexes. He exhibits normal muscle tone. Coordination normal.  Skin: Skin is warm and dry. He is not diaphoretic. No erythema. No pallor.  Psychiatric: He has a normal mood and affect. His behavior is normal. Judgment and thought content normal.          Assessment & Plan:  Human immunodeficiency virus (HIV) disease Continue bid isentress, truvada and now XR viramune  DEPRESSION Start wellbutrin to rx this and smoking aide too  Smoker Start wellbutrin

## 2012-08-14 ENCOUNTER — Other Ambulatory Visit: Payer: Self-pay | Admitting: *Deleted

## 2012-08-14 DIAGNOSIS — B20 Human immunodeficiency virus [HIV] disease: Secondary | ICD-10-CM

## 2012-08-14 MED ORDER — NEVIRAPINE ER 400 MG PO TB24: 400.0000 mg | ORAL_TABLET | Freq: Every day | ORAL | Status: DC

## 2012-08-14 MED ORDER — RALTEGRAVIR POTASSIUM 400 MG PO TABS: 400.0000 mg | ORAL_TABLET | Freq: Two times a day (BID) | ORAL | Status: DC

## 2012-08-14 MED ORDER — EMTRICITABINE-TENOFOVIR DF 200-300 MG PO TABS: 1.0000 | ORAL_TABLET | Freq: Every day | ORAL | Status: DC

## 2012-08-30 ENCOUNTER — Ambulatory Visit: Payer: Self-pay

## 2012-08-30 ENCOUNTER — Other Ambulatory Visit: Payer: Self-pay

## 2012-09-06 ENCOUNTER — Other Ambulatory Visit: Payer: Self-pay

## 2012-09-06 DIAGNOSIS — B20 Human immunodeficiency virus [HIV] disease: Secondary | ICD-10-CM

## 2012-09-06 LAB — COMPLETE METABOLIC PANEL WITH GFR
ALT: 15 U/L (ref 0–53)
AST: 21 U/L (ref 0–37)
Albumin: 4.4 g/dL (ref 3.5–5.2)
Alkaline Phosphatase: 95 U/L (ref 39–117)
Glucose, Bld: 91 mg/dL (ref 70–99)
Potassium: 4.3 mEq/L (ref 3.5–5.3)
Sodium: 140 mEq/L (ref 135–145)
Total Bilirubin: 0.2 mg/dL — ABNORMAL LOW (ref 0.3–1.2)
Total Protein: 6.8 g/dL (ref 6.0–8.3)

## 2012-09-06 LAB — CBC WITH DIFFERENTIAL/PLATELET
Basophils Absolute: 0 10*3/uL (ref 0.0–0.1)
Basophils Relative: 1 % (ref 0–1)
Eosinophils Relative: 6 % — ABNORMAL HIGH (ref 0–5)
HCT: 40 % (ref 39.0–52.0)
Hemoglobin: 14.3 g/dL (ref 13.0–17.0)
MCH: 29.9 pg (ref 26.0–34.0)
MCHC: 35.8 g/dL (ref 30.0–36.0)
MCV: 83.5 fL (ref 78.0–100.0)
Monocytes Absolute: 0.4 10*3/uL (ref 0.1–1.0)
Monocytes Relative: 10 % (ref 3–12)
Neutro Abs: 1.4 10*3/uL — ABNORMAL LOW (ref 1.7–7.7)
RDW: 12.7 % (ref 11.5–15.5)

## 2012-09-07 LAB — HIV-1 RNA QUANT-NO REFLEX-BLD
HIV 1 RNA Quant: <20 copies/mL (ref <20)
HIV-1 RNA Quant, Log: <1.3 {Log} (ref <1.30)

## 2012-09-07 LAB — T-HELPER CELL (CD4) - (RCID CLINIC ONLY): CD4 % Helper T Cell: 18 % — ABNORMAL LOW (ref 33–55)

## 2012-09-13 ENCOUNTER — Ambulatory Visit (INDEPENDENT_AMBULATORY_CARE_PROVIDER_SITE_OTHER): Payer: Self-pay | Admitting: Infectious Disease

## 2012-09-13 ENCOUNTER — Encounter: Payer: Self-pay | Admitting: Infectious Disease

## 2012-09-13 ENCOUNTER — Telehealth: Payer: Self-pay | Admitting: *Deleted

## 2012-09-13 ENCOUNTER — Ambulatory Visit: Payer: Self-pay

## 2012-09-13 VITALS — BP 130/86 | HR 82 | Temp 97.8°F | Ht 77.0 in | Wt 205.0 lb

## 2012-09-13 DIAGNOSIS — R05 Cough: Secondary | ICD-10-CM

## 2012-09-13 DIAGNOSIS — Z23 Encounter for immunization: Secondary | ICD-10-CM

## 2012-09-13 DIAGNOSIS — F17200 Nicotine dependence, unspecified, uncomplicated: Secondary | ICD-10-CM

## 2012-09-13 DIAGNOSIS — B20 Human immunodeficiency virus [HIV] disease: Secondary | ICD-10-CM

## 2012-09-13 DIAGNOSIS — F329 Major depressive disorder, single episode, unspecified: Secondary | ICD-10-CM

## 2012-09-13 MED ORDER — BREATHERITE COLL SPACER ADULT MISC: 1.0000 | Freq: Four times a day (QID) | Status: DC | PRN

## 2012-09-13 MED ORDER — DOLUTEGRAVIR SODIUM 50 MG PO TABS: 50.0000 mg | ORAL_TABLET | Freq: Every day | ORAL | Status: DC

## 2012-09-13 MED ORDER — ALBUTEROL SULFATE HFA 108 (90 BASE) MCG/ACT IN AERS: 2.0000 | INHALATION_SPRAY | Freq: Four times a day (QID) | RESPIRATORY_TRACT | Status: DC | PRN

## 2012-09-13 MED ORDER — RALTEGRAVIR POTASSIUM 400 MG PO TABS: 400.0000 mg | ORAL_TABLET | Freq: Two times a day (BID) | ORAL | Status: DC

## 2012-09-13 NOTE — Progress Notes (Signed)
  Subjective:    Patient ID: Hayden Johns, male    DOB: 25-Sep-1981, 31 y.o.   MRN: [id number]  HPI  31 y.o. male African american who is doing superbly well on his antiviral regimen of twice daily isentress, viramune and once daily truvada, with undetectable viral load and health cd4 count. His cough continues to bother him despite PPI. I would like to see if inhaled beta agonist might help him. 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    Review of Systems  Constitutional: Negative for fever, chills, diaphoresis, activity change, appetite change, fatigue and unexpected weight change.  HENT: Negative for congestion, sore throat, rhinorrhea, sneezing, trouble swallowing and sinus pressure.   Eyes: Negative for photophobia and visual disturbance.  Respiratory: Positive for cough. Negative for chest tightness, shortness of breath, wheezing and stridor.   Cardiovascular: Negative for chest pain, palpitations and leg swelling.  Gastrointestinal: Negative for nausea, vomiting, abdominal pain, diarrhea, constipation, blood in stool, abdominal distention and anal bleeding.  Genitourinary: Negative for dysuria, hematuria, flank pain and difficulty urinating.  Musculoskeletal: Negative for myalgias, back pain, joint swelling, arthralgias and gait problem.  Skin: Negative for color change, pallor, rash and wound.  Neurological: Negative for dizziness, tremors, weakness and light-headedness.  Hematological: Negative for adenopathy. Does not bruise/bleed easily.  Psychiatric/Behavioral: Negative for behavioral problems, confusion, disturbed wake/sleep cycle, dysphoric mood, decreased concentration and agitation.       Objective:   Physical Exam  Constitutional: He is oriented to person, place, and time. He appears well-developed and well-nourished. No distress.  HENT:  Head: Normocephalic and atraumatic.    Mouth/Throat: Oropharynx is clear and moist. No oropharyngeal exudate.  Eyes: Conjunctivae normal and EOM are normal. Pupils are equal, round, and reactive to light. No scleral icterus.  Neck: Normal range of motion. Neck supple. No JVD present.  Cardiovascular: Normal rate, regular rhythm and normal heart sounds.  Exam reveals no gallop and no friction rub.   No murmur heard. Pulmonary/Chest: Effort normal and breath sounds normal. No respiratory distress. He has no wheezes. He has no rales. He exhibits no tenderness.  Abdominal: He exhibits no distension and no mass. There is no tenderness. There is no rebound and no guarding.  Musculoskeletal: He exhibits no edema and no tenderness.  Lymphadenopathy:    He has no cervical adenopathy.  Neurological: He is alert and oriented to person, place, and time. He has normal reflexes. He exhibits normal muscle tone. Coordination normal.  Skin: Skin is warm and dry. He is not diaphoretic. No erythema. No pallor.  Psychiatric: He has a normal mood and affect. His behavior is normal. Judgment and thought content normal.          Assessment & Plan:  Human immunodeficiency virus (HIV) disease Continue current regimen. Wonder if He TRULY needs all three pills nand ? Viramune and truvada would suppress him sufficienty given he only has 184V  COUGH, NON-PRODUCTIVE Try beta agonist  Smoker Has quit on wellbutrin  DEPRESSION On wellbutrin

## 2012-09-13 NOTE — Assessment & Plan Note (Signed)
Continue current regimen. Wonder if He TRULY needs all three pills nand ? Viramune and truvada would suppress him sufficienty given he only has 184V

## 2012-09-13 NOTE — Assessment & Plan Note (Signed)
On wellbutrin.  ?

## 2012-09-13 NOTE — Assessment & Plan Note (Signed)
Try beta agonist

## 2012-09-13 NOTE — Telephone Encounter (Signed)
Call from [place], [place].  Received rx for Tybicay today.  Wondering if pt is to remain on Isentress and Truvada? Dr. Daiva Eves, please advise.

## 2012-09-13 NOTE — Assessment & Plan Note (Signed)
Has quit on wellbutrin

## 2012-09-13 NOTE — Progress Notes (Signed)
RN spoke with MD.  Pt.  Not started on Tivicay due to interaction per Dr. Daiva Eves.  Spoke with [facility], relayed information.

## 2012-09-18 ENCOUNTER — Ambulatory Visit: Payer: Self-pay

## 2012-10-16 ENCOUNTER — Other Ambulatory Visit: Payer: Self-pay | Admitting: *Deleted

## 2012-10-16 DIAGNOSIS — B20 Human immunodeficiency virus [HIV] disease: Secondary | ICD-10-CM

## 2012-10-16 MED ORDER — EMTRICITABINE-TENOFOVIR DF 200-300 MG PO TABS: 1.0000 | ORAL_TABLET | Freq: Every day | ORAL | Status: DC

## 2012-10-16 MED ORDER — RALTEGRAVIR POTASSIUM 400 MG PO TABS: 400.0000 mg | ORAL_TABLET | Freq: Two times a day (BID) | ORAL | Status: DC

## 2013-01-31 ENCOUNTER — Other Ambulatory Visit: Payer: Self-pay | Admitting: Licensed Clinical Social Worker

## 2013-01-31 DIAGNOSIS — F329 Major depressive disorder, single episode, unspecified: Secondary | ICD-10-CM

## 2013-01-31 MED ORDER — BUPROPION HCL ER (XL) 150 MG PO TB24: ORAL_TABLET | ORAL | Status: DC

## 2013-02-02 ENCOUNTER — Encounter (HOSPITAL_BASED_OUTPATIENT_CLINIC_OR_DEPARTMENT_OTHER): Payer: Self-pay | Admitting: *Deleted

## 2013-02-02 ENCOUNTER — Emergency Department (HOSPITAL_BASED_OUTPATIENT_CLINIC_OR_DEPARTMENT_OTHER)
Admission: EM | Admit: 2013-02-02 | Discharge: 2013-02-02 | Disposition: A | Discharge status: Home / Self Care | Payer: Self-pay | Attending: Emergency Medicine | Admitting: Emergency Medicine

## 2013-02-02 DIAGNOSIS — R51 Headache: Secondary | ICD-10-CM | POA: Insufficient documentation

## 2013-02-02 DIAGNOSIS — Z79899 Other long term (current) drug therapy: Secondary | ICD-10-CM | POA: Insufficient documentation

## 2013-02-02 DIAGNOSIS — Z87891 Personal history of nicotine dependence: Secondary | ICD-10-CM | POA: Insufficient documentation

## 2013-02-02 DIAGNOSIS — K029 Dental caries, unspecified: Secondary | ICD-10-CM

## 2013-02-02 DIAGNOSIS — Z21 Asymptomatic human immunodeficiency virus [HIV] infection status: Secondary | ICD-10-CM | POA: Insufficient documentation

## 2013-02-02 HISTORY — DX: Human immunodeficiency virus (HIV) disease: B20

## 2013-02-02 MED ORDER — PENICILLIN V POTASSIUM 500 MG PO TABS: 500.0000 mg | ORAL_TABLET | Freq: Four times a day (QID) | ORAL | Status: AC

## 2013-02-02 MED ORDER — HYDROCODONE-ACETAMINOPHEN 5-325 MG PO TABS: 1.0000 | ORAL_TABLET | ORAL | Status: DC | PRN

## 2013-02-02 NOTE — ED Notes (Signed)
Dental pain off and on x 1 week but constant this week.

## 2013-02-02 NOTE — ED Provider Notes (Signed)
History     CSN: [id number]  Arrival date & time 02/02/13  2044   First MD Initiated Contact with Patient 02/02/13 2137      Chief Complaint  Patient presents with  . Dental Pain    HPI Hayden Johns is a 32 y.o. male who presents to the ED with dental pain. The pain started off and on about a week ago but now is constant. He describes the pain as throbbing. The pain is located in the wisdom tooth on the right lower. He is taking aspirin that helped at first but no longer controls the pain. The history was provided by the patient.  Past Medical History  Diagnosis Date  . HIV disease     History reviewed. No pertinent past surgical history.  No family history on file.  History  Substance Use Topics  . Smoking status: Former Smoker    Types: Cigarettes    Quit date: 09/06/2011  . Smokeless tobacco: Never Used  . Alcohol Use: 3.0 oz/week    6 drink(s) per week      Review of Systems  Constitutional: Negative for fever, chills and appetite change.  HENT: Positive for dental problem.   Gastrointestinal: Negative for nausea, vomiting and abdominal pain.  Musculoskeletal: Negative for back pain.  Skin: Negative for rash.  Neurological: Positive for headaches. Negative for dizziness.  Psychiatric/Behavioral: Negative for confusion. The patient is not nervous/anxious.     Allergies  Sulfamethoxazole w-trimethoprim  Home Medications   Current Outpatient Rx  Name  Route  Sig  Dispense  Refill  . albuterol (PROVENTIL HFA;VENTOLIN HFA) 108 (90 BASE) MCG/ACT inhaler   Inhalation   Inhale 2 puffs into the lungs every 6 (six) hours as needed for wheezing.   1 Inhaler   6   . buPROPion (WELLBUTRIN XL) 150 MG 24 hr tablet      Take twice daily   60 tablet   11   . emtricitabine-tenofovir (TRUVADA) 200-300 MG per tablet   Oral   Take 1 tablet by mouth daily.   30 tablet   12   . Nevirapine 400 MG TB24   Oral   Take 400 mg by mouth daily.   30 tablet    11   . omeprazole (PRILOSEC OTC) 20 MG tablet   Oral   Take 1 tablet (20 mg total) by mouth daily.   30 tablet   11   . raltegravir (ISENTRESS) 400 MG tablet   Oral   Take 1 tablet (400 mg total) by mouth 2 (two) times daily.   60 tablet   11   . Spacer/Aero-Holding Chambers (BREATHERITE COLL SPACER ADULT) MISC   Does not apply   1 Container by Does not apply route 4 (four) times daily as needed.   1 each   3     BP 137/93  Pulse 85  Temp(Src) 98.2 F (36.8 C) (Oral)  Resp 16  SpO2 100%  Physical Exam  Nursing note and vitals reviewed. Constitutional: He is oriented to person, place, and time. He appears well-developed and well-nourished. No distress.  HENT:  Head: Normocephalic and atraumatic.  Right Ear: Tympanic membrane normal.  Left Ear: Tympanic membrane normal.  Nose: Nose normal.  Mouth/Throat:    Eyes: EOM are normal. Pupils are equal, round, and reactive to light.  Neck: Neck supple.  Cardiovascular: Normal rate and regular rhythm.   Pulmonary/Chest: Effort normal and breath sounds normal.  Abdominal: Soft. There is no tenderness.  Musculoskeletal: Normal range of motion. He exhibits no edema.  Neurological: He is alert and oriented to person, place, and time. No cranial nerve deficit.  Skin: Skin is warm and dry.  Psychiatric: He has a normal mood and affect. His behavior is normal. Judgment and thought content normal.   Procedures   Assessment: 32 y.o. male with dental pain and dental caries   Plan:  Antibiotics and pain medication   Follow up with dentist ASAP Discussed with the patient and all questioned fully answered. He will return if any problems arise.   Medication List    TAKE these medications       HYDROcodone-acetaminophen 5-325 MG per tablet  Commonly known as:  NORCO/VICODIN  Take 1 tablet by mouth every 4 (four) hours as needed for pain.     penicillin v potassium 500 MG tablet  Commonly known as:  VEETID  Take 1 tablet  (500 mg total) by mouth 4 (four) times daily.      ASK your doctor about these medications       albuterol 108 (90 BASE) MCG/ACT inhaler  Commonly known as:  PROVENTIL HFA;VENTOLIN HFA  Inhale 2 puffs into the lungs every 6 (six) hours as needed for wheezing.     BreatheRite Coll Spacer Adult Misc  1 Container by Does not apply route 4 (four) times daily as needed.     buPROPion 150 MG 24 hr tablet  Commonly known as:  WELLBUTRIN XL  Take twice daily     emtricitabine-tenofovir 200-300 MG per tablet  Commonly known as:  TRUVADA  Take 1 tablet by mouth daily.     Nevirapine 400 MG Tb24  Take 400 mg by mouth daily.     omeprazole 20 MG tablet  Commonly known as:  PRILOSEC OTC  Take 1 tablet (20 mg total) by mouth daily.     raltegravir 400 MG tablet  Commonly known as:  ISENTRESS  Take 1 tablet (400 mg total) by mouth 2 (two) times daily.            [place], [place] 02/02/13 2156

## 2013-02-03 NOTE — ED Provider Notes (Signed)
Medical screening examination/treatment/procedure(s) were performed by non-physician practitioner and as supervising physician I was immediately available for consultation/collaboration.  Charles B. Sheldon, MD 02/03/13 0007 

## 2013-02-07 ENCOUNTER — Other Ambulatory Visit: Payer: Self-pay | Admitting: *Deleted

## 2013-02-07 DIAGNOSIS — R0989 Other specified symptoms and signs involving the circulatory and respiratory systems: Secondary | ICD-10-CM

## 2013-02-07 MED ORDER — OMEPRAZOLE MAGNESIUM 20 MG PO TBEC: 20.0000 mg | DELAYED_RELEASE_TABLET | Freq: Every day | ORAL | Status: DC

## 2013-03-05 ENCOUNTER — Other Ambulatory Visit (INDEPENDENT_AMBULATORY_CARE_PROVIDER_SITE_OTHER): Payer: Self-pay

## 2013-03-05 ENCOUNTER — Ambulatory Visit: Payer: Self-pay

## 2013-03-05 ENCOUNTER — Other Ambulatory Visit: Payer: Self-pay | Admitting: Infectious Disease

## 2013-03-05 DIAGNOSIS — Z113 Encounter for screening for infections with a predominantly sexual mode of transmission: Secondary | ICD-10-CM

## 2013-03-05 DIAGNOSIS — B20 Human immunodeficiency virus [HIV] disease: Secondary | ICD-10-CM

## 2013-03-05 DIAGNOSIS — Z79899 Other long term (current) drug therapy: Secondary | ICD-10-CM

## 2013-03-05 LAB — LIPID PANEL
HDL: 48 mg/dL (ref >39)
Total CHOL/HDL Ratio: 2.5 Ratio

## 2013-03-05 LAB — CBC WITH DIFFERENTIAL/PLATELET
Lymphocytes Relative: 44 % (ref 12–46)
Lymphs Abs: 2.3 10*3/uL (ref 0.7–4.0)
MCV: 82.7 fL (ref 78.0–100.0)
Neutro Abs: 1.9 10*3/uL (ref 1.7–7.7)
Neutrophils Relative %: 38 % — ABNORMAL LOW (ref 43–77)
Platelets: 251 10*3/uL (ref 150–400)
RBC: 4.98 MIL/uL (ref 4.22–5.81)
WBC: 5.1 10*3/uL (ref 4.0–10.5)

## 2013-03-05 LAB — COMPLETE METABOLIC PANEL WITH GFR
AST: 32 U/L (ref 0–37)
BUN: 15 mg/dL (ref 6–23)
Calcium: 9.5 mg/dL (ref 8.4–10.5)
Chloride: 105 mEq/L (ref 96–112)
Creat: 1.04 mg/dL (ref 0.50–1.35)
GFR, Est African American: >89 mL/min
GFR, Est Non African American: >89 mL/min

## 2013-03-05 LAB — RPR

## 2013-03-06 LAB — T-HELPER CELL (CD4) - (RCID CLINIC ONLY)
CD4 % Helper T Cell: 20 % — ABNORMAL LOW (ref 33–55)
CD4 T Cell Abs: 480 uL (ref 400–2700)

## 2013-03-06 LAB — HIV-1 RNA QUANT-NO REFLEX-BLD: HIV 1 RNA Quant: <20 copies/mL (ref <20)

## 2013-03-07 ENCOUNTER — Other Ambulatory Visit: Payer: Self-pay

## 2013-03-21 ENCOUNTER — Ambulatory Visit: Payer: Self-pay | Admitting: Infectious Disease

## 2013-04-09 ENCOUNTER — Encounter: Payer: Self-pay | Admitting: Licensed Clinical Social Worker

## 2013-04-09 ENCOUNTER — Other Ambulatory Visit: Payer: Self-pay | Admitting: Licensed Clinical Social Worker

## 2013-04-09 DIAGNOSIS — R0989 Other specified symptoms and signs involving the circulatory and respiratory systems: Secondary | ICD-10-CM

## 2013-04-09 MED ORDER — OMEPRAZOLE MAGNESIUM 20 MG PO TBEC: 20.0000 mg | DELAYED_RELEASE_TABLET | Freq: Every day | ORAL | Status: DC

## 2013-04-20 ENCOUNTER — Other Ambulatory Visit: Payer: Self-pay | Admitting: Licensed Clinical Social Worker

## 2013-04-20 DIAGNOSIS — B20 Human immunodeficiency virus [HIV] disease: Secondary | ICD-10-CM

## 2013-04-20 MED ORDER — NEVIRAPINE ER 400 MG PO TB24: 400.0000 mg | ORAL_TABLET | Freq: Every day | ORAL | Status: DC

## 2013-04-23 ENCOUNTER — Ambulatory Visit: Payer: Self-pay | Admitting: Infectious Disease

## 2013-04-24 ENCOUNTER — Ambulatory Visit (INDEPENDENT_AMBULATORY_CARE_PROVIDER_SITE_OTHER): Payer: Self-pay | Admitting: Infectious Disease

## 2013-04-24 ENCOUNTER — Encounter: Payer: Self-pay | Admitting: Infectious Disease

## 2013-04-24 VITALS — BP 151/93 | HR 80 | Temp 98.3°F | Ht 77.0 in | Wt 212.0 lb

## 2013-04-24 DIAGNOSIS — F329 Major depressive disorder, single episode, unspecified: Secondary | ICD-10-CM

## 2013-04-24 DIAGNOSIS — Z5189 Encounter for other specified aftercare: Secondary | ICD-10-CM

## 2013-04-24 DIAGNOSIS — G4700 Insomnia, unspecified: Secondary | ICD-10-CM

## 2013-04-24 DIAGNOSIS — B20 Human immunodeficiency virus [HIV] disease: Secondary | ICD-10-CM

## 2013-04-24 MED ORDER — RITONAVIR 100 MG PO TABS: 100.0000 mg | ORAL_TABLET | Freq: Every day | ORAL | Status: DC

## 2013-04-24 MED ORDER — ZOLPIDEM TARTRATE 10 MG PO TABS: 10.0000 mg | ORAL_TABLET | Freq: Every evening | ORAL | Status: DC | PRN

## 2013-04-24 MED ORDER — DARUNAVIR ETHANOLATE 800 MG PO TABS: 800.0000 mg | ORAL_TABLET | Freq: Every day | ORAL | Status: DC

## 2013-04-24 MED ORDER — ONDANSETRON HCL 4 MG PO TABS: 4.0000 mg | ORAL_TABLET | Freq: Three times a day (TID) | ORAL | Status: DC | PRN

## 2013-04-24 NOTE — Progress Notes (Signed)
Subjective:    Patient ID: Hayden Johns, male    DOB: [birth year], 32 y.o.   MRN: [id number]  HPI   32 y.o. male African american who has been doing superbly well on his antiviral regimen of twice daily isentress, viramune and once daily truvada, with undetectable viral load and health cd4 count.   He does continue to miss nightly dose of isentress and desires dose simplification.   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  I am comfortable switching to pRezista, norvir and truvada once daily for dose simplification  He is also suffering from insomnia and would like something that would help him sleep--trazadone is not working.  We spent greater than 45 minutes with the patient including greater than 50% of time in face to face counsel of the patient and in coordination of their care.   Review of Systems  Constitutional: Negative for fever, chills, diaphoresis, activity change, appetite change, fatigue and unexpected weight change.  HENT: Negative for congestion, sore throat, rhinorrhea, sneezing, trouble swallowing and sinus pressure.   Eyes: Negative for photophobia and visual disturbance.  Respiratory: Positive for cough. Negative for chest tightness, shortness of breath, wheezing and stridor.   Cardiovascular: Negative for chest pain, palpitations and leg swelling.  Gastrointestinal: Negative for nausea, vomiting, abdominal pain, diarrhea, constipation, blood in stool, abdominal distention and anal bleeding.  Genitourinary: Negative for dysuria, hematuria, flank pain and difficulty urinating.  Musculoskeletal: Negative for myalgias, back pain, joint swelling, arthralgias and gait problem.  Skin: Negative for color change, pallor, rash and wound.  Neurological: Negative for dizziness, tremors, weakness and light-headedness.  Hematological: Negative for adenopathy. Does not bruise/bleed easily.   Psychiatric/Behavioral: Negative for behavioral problems, confusion, sleep disturbance, dysphoric mood, decreased concentration and agitation.       Objective:   Physical Exam  Constitutional: He is oriented to person, place, and time. He appears well-developed and well-nourished. No distress.  HENT:  Head: Normocephalic and atraumatic.  Mouth/Throat: Oropharynx is clear and moist. No oropharyngeal exudate.  Eyes: Conjunctivae and EOM are normal. Pupils are equal, round, and reactive to light. No scleral icterus.  Neck: Normal range of motion. Neck supple. No JVD present.  Cardiovascular: Normal rate, regular rhythm and normal heart sounds.  Exam reveals no gallop and no friction rub.   No murmur heard. Pulmonary/Chest: Effort normal and breath sounds normal. No respiratory distress. He has no wheezes. He has no rales. He exhibits no tenderness.  Abdominal: He exhibits no distension and no mass. There is no tenderness. There is no rebound and no guarding.  Musculoskeletal: He exhibits no edema and no tenderness.  Lymphadenopathy:    He has no cervical adenopathy.  Neurological: He is alert and oriented to person, place, and time. He has normal reflexes. He exhibits normal muscle tone. Coordination normal.  Skin: Skin is warm and dry. He is not diaphoretic. No erythema. No pallor.  Psychiatric: He has a normal mood and affect. His behavior is normal. Judgment and thought content normal.          Assessment & Plan:  HIV: swtich to Prezista, norvir and truavada, rtc in 2 months for recheck labs  INsomnia: start ambien  Depression: taking wellbutrin, levels will come down slightlpy down with norvir, offered him referral to Hayden Johns but did not want this now  Sulfa allergy: will be careful with monitoring for rash on Prezista, incidence of cross  reactivity is low

## 2013-05-01 NOTE — Progress Notes (Signed)
HPI: Hayden Johns is a 32 y.o. male who is here for his HIV follow up visit.   Allergies: Allergies  Allergen Reactions  . Sulfamethoxazole W-Trimethoprim     REACTION: whole body rash    Vitals:    Past Medical History: Past Medical History  Diagnosis Date  . HIV disease     Social History: History   Social History  . Marital Status: Single    Spouse Name: N/A    Number of Children: N/A  . Years of Education: N/A   Social History Main Topics  . Smoking status: Former Smoker    Types: Cigarettes    Quit date: 09/06/2011  . Smokeless tobacco: Never Used  . Alcohol Use: 3.0 oz/week    6 drink(s) per week  . Drug Use: No  . Sexually Active: Not Currently -- Male partner(s)     Comment: given condoms   Other Topics Concern  . None   Social History Narrative  . None    Previous Regimen: RAL +Truvada  Current Regimen: RAL + NVP + Truvada  Labs: HIV 1 RNA Quant (copies/mL)  Date Value  03/05/2013 <20   09/06/2012 <20   02/29/2012 <20      CD4 T Cell Abs (cmm)  Date Value  03/05/2013 480   09/06/2012 410   02/29/2012 310*     Hep B S Ab (no units)  Date Value  11/10/2010 POS*     Hepatitis B Surface Ag (no units)  Date Value  11/10/2010 NEGATIVE      HCV Ab (no units)  Date Value  11/10/2010 NEGATIVE     CrCl: Estimated Creatinine Clearance: 127.3 ml/min (by C-G formula based on Cr of 1.04).  Lipids:    Component Value Date/Time   CHOL 121 03/05/2013 1033   TRIG 67 03/05/2013 1033   HDL 48 03/05/2013 1033   CHOLHDL 2.5 03/05/2013 1033   VLDL 13 03/05/2013 1033   LDLCALC 60 03/05/2013 1033    Assessment: 32 yo who is well controlled on his current regimen. 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 I offered the option of simplifying his regimen to a boosted PI. He was agreeable to that. D/w Dr. Daiva Eves, who is also agreeable.   Recommendations: Change current regimen to DRV/r + Truvada  Clide Cliff, PharmD Clinical Infectious Disease Pharmacist [facility] 05/01/2013, 12:09 AM

## 2013-06-19 ENCOUNTER — Other Ambulatory Visit: Payer: Self-pay

## 2013-06-28 ENCOUNTER — Ambulatory Visit: Payer: Self-pay | Admitting: Infectious Disease

## 2013-07-03 ENCOUNTER — Other Ambulatory Visit: Payer: Self-pay

## 2013-07-03 DIAGNOSIS — B20 Human immunodeficiency virus [HIV] disease: Secondary | ICD-10-CM

## 2013-07-03 LAB — CBC WITH DIFFERENTIAL/PLATELET
Basophils Absolute: 0 10*3/uL (ref 0.0–0.1)
HCT: 42.5 % (ref 39.0–52.0)
Lymphocytes Relative: 49 % — ABNORMAL HIGH (ref 12–46)
Lymphs Abs: 2.2 10*3/uL (ref 0.7–4.0)
Monocytes Absolute: 0.4 10*3/uL (ref 0.1–1.0)
Neutro Abs: 1.6 10*3/uL — ABNORMAL LOW (ref 1.7–7.7)
Platelets: 294 10*3/uL (ref 150–400)
RBC: 5.11 MIL/uL (ref 4.22–5.81)
RDW: 14 % (ref 11.5–15.5)
WBC: 4.5 10*3/uL (ref 4.0–10.5)

## 2013-07-04 LAB — T-HELPER CELL (CD4) - (RCID CLINIC ONLY)
CD4 % Helper T Cell: 22 % — ABNORMAL LOW (ref 33–55)
CD4 T Cell Abs: 500 uL (ref 400–2700)

## 2013-07-04 LAB — COMPLETE METABOLIC PANEL WITH GFR
AST: 21 U/L (ref 0–37)
Albumin: 4.5 g/dL (ref 3.5–5.2)
Alkaline Phosphatase: 86 U/L (ref 39–117)
BUN: 10 mg/dL (ref 6–23)
Creat: 1.02 mg/dL (ref 0.50–1.35)
GFR, Est Non African American: >89 mL/min
Glucose, Bld: 93 mg/dL (ref 70–99)
Potassium: 4.1 mEq/L (ref 3.5–5.3)

## 2013-07-05 LAB — HIV-1 RNA QUANT-NO REFLEX-BLD: HIV 1 RNA Quant: <20 copies/mL (ref <20)

## 2013-07-12 ENCOUNTER — Encounter: Payer: Self-pay | Admitting: Infectious Disease

## 2013-07-12 ENCOUNTER — Ambulatory Visit (INDEPENDENT_AMBULATORY_CARE_PROVIDER_SITE_OTHER): Payer: Self-pay | Admitting: Infectious Disease

## 2013-07-12 VITALS — BP 142/94 | HR 82 | Temp 97.7°F | Ht 77.0 in | Wt 213.0 lb

## 2013-07-12 DIAGNOSIS — F411 Generalized anxiety disorder: Secondary | ICD-10-CM

## 2013-07-12 DIAGNOSIS — I10 Essential (primary) hypertension: Secondary | ICD-10-CM

## 2013-07-12 DIAGNOSIS — F329 Major depressive disorder, single episode, unspecified: Secondary | ICD-10-CM

## 2013-07-12 DIAGNOSIS — B20 Human immunodeficiency virus [HIV] disease: Secondary | ICD-10-CM

## 2013-07-12 MED ORDER — CLONAZEPAM 0.5 MG PO TBDP: 0.5000 mg | ORAL_TABLET | Freq: Every evening | ORAL | Status: DC | PRN

## 2013-07-12 NOTE — Progress Notes (Signed)
  Subjective:    Patient ID: Hayden Johns, male    DOB: 03-Nov-1981, 32 y.o.   MRN: [id number]  HPI   32 y.o. male African [place] with 184V mutation present recently changed to PRezista, norvir and truvada once daily for dose simplification with nice virological suppression and healthy CD4 count.  Hayden Johns is taking wellbutrin for depression but still anxious from job. I am ok to have him start klonopin for this.    Review of Systems  Constitutional: Negative for fever, chills, diaphoresis, activity change, appetite change, fatigue and unexpected weight change.  HENT: Negative for congestion, sore throat, rhinorrhea, sneezing, trouble swallowing and sinus pressure.   Eyes: Negative for photophobia and visual disturbance.  Respiratory: Positive for cough. Negative for chest tightness, shortness of breath, wheezing and stridor.   Cardiovascular: Negative for chest pain, palpitations and leg swelling.  Gastrointestinal: Negative for nausea, vomiting, abdominal pain, diarrhea, constipation, blood in stool, abdominal distention and anal bleeding.  Genitourinary: Negative for dysuria, hematuria, flank pain and difficulty urinating.  Musculoskeletal: Negative for myalgias, back pain, joint swelling, arthralgias and gait problem.  Skin: Negative for color change, pallor, rash and wound.  Neurological: Negative for dizziness, tremors, weakness and light-headedness.  Hematological: Negative for adenopathy. Does not bruise/bleed easily.  Psychiatric/Behavioral: Negative for behavioral problems, confusion, sleep disturbance, dysphoric mood, decreased concentration and agitation.       Objective:   Physical Exam  Constitutional: Hayden Johns is oriented to person, place, and time. Hayden Johns appears well-developed and well-nourished. No distress.  HENT:  Head: Normocephalic and atraumatic.  Mouth/Throat: Oropharynx is clear and moist. No oropharyngeal exudate.  Eyes: Conjunctivae and EOM are normal. Pupils are  equal, round, and reactive to light. No scleral icterus.  Neck: Normal range of motion. Neck supple. No JVD present.  Cardiovascular: Normal rate, regular rhythm and normal heart sounds.  Exam reveals no gallop and no friction rub.   No murmur heard. Pulmonary/Chest: Effort normal and breath sounds normal. No respiratory distress. Hayden Johns has no wheezes. Hayden Johns has no rales. Hayden Johns exhibits no tenderness.  Abdominal: Hayden Johns exhibits no distension and no mass. There is no tenderness. There is no rebound and no guarding.  Musculoskeletal: Hayden Johns exhibits no edema and no tenderness.  Lymphadenopathy:    Hayden Johns has no cervical adenopathy.  Neurological: Hayden Johns is alert and oriented to person, place, and time. Hayden Johns has normal reflexes. Hayden Johns exhibits normal muscle tone. Coordination normal.  Skin: Skin is warm and dry. Hayden Johns is not diaphoretic. No erythema. No pallor.  Psychiatric: Hayden Johns has a normal mood and affect. His behavior is normal. Judgment and thought content normal.          Assessment & Plan:  HIV: continue Prezista, norvir and truavada, rtc in 2 months for recheck labs  INsomnia: change to clonazepam  Depression: taking wellbutrin, adding in klonopin  HTN: could be partly due to anxiety but likely will need anti HTN, keep BP log

## 2013-09-10 ENCOUNTER — Other Ambulatory Visit: Payer: Self-pay

## 2013-09-17 ENCOUNTER — Ambulatory Visit: Payer: Self-pay

## 2013-09-17 ENCOUNTER — Telehealth: Payer: Self-pay | Admitting: *Deleted

## 2013-09-17 ENCOUNTER — Other Ambulatory Visit (INDEPENDENT_AMBULATORY_CARE_PROVIDER_SITE_OTHER): Payer: BC Managed Care – PPO

## 2013-09-17 DIAGNOSIS — B20 Human immunodeficiency virus [HIV] disease: Secondary | ICD-10-CM

## 2013-09-17 LAB — CBC WITH DIFFERENTIAL/PLATELET
Basophils Relative: 1 % (ref 0–1)
Eosinophils Relative: 8 % — ABNORMAL HIGH (ref 0–5)
HCT: 43.8 % (ref 39.0–52.0)
Hemoglobin: 15 g/dL (ref 13.0–17.0)
MCHC: 34.2 g/dL (ref 30.0–36.0)
MCV: 83.3 fL (ref 78.0–100.0)
Monocytes Absolute: 0.4 10*3/uL (ref 0.1–1.0)
Monocytes Relative: 8 % (ref 3–12)
Neutro Abs: 1.8 10*3/uL (ref 1.7–7.7)
RDW: 13 % (ref 11.5–15.5)

## 2013-09-17 LAB — COMPLETE METABOLIC PANEL WITH GFR
ALT: 18 U/L (ref 0–53)
AST: 25 U/L (ref 0–37)
Albumin: 4.6 g/dL (ref 3.5–5.2)
Alkaline Phosphatase: 79 U/L (ref 39–117)
GFR, Est Non African American: >89 mL/min
Potassium: 3.9 mEq/L (ref 3.5–5.3)
Sodium: 140 mEq/L (ref 135–145)
Total Bilirubin: 0.3 mg/dL (ref 0.3–1.2)
Total Protein: 7.3 g/dL (ref 6.0–8.3)

## 2013-09-17 LAB — RPR

## 2013-09-17 NOTE — Telephone Encounter (Signed)
Hayden Johns came in to get help with his medications.  I gave him co-pay cards for Truvada, Norvir and Prezista.

## 2013-09-18 LAB — HIV-1 RNA QUANT-NO REFLEX-BLD
HIV 1 RNA Quant: <20 copies/mL (ref <20)
HIV-1 RNA Quant, Log: <1.3 {Log} (ref <1.30)

## 2013-09-18 LAB — T-HELPER CELL (CD4) - (RCID CLINIC ONLY): CD4 % Helper T Cell: 21 % — ABNORMAL LOW (ref 33–55)

## 2013-09-18 LAB — MICROALBUMIN / CREATININE URINE RATIO: Microalb Creat Ratio: 2.3 mg/g (ref 0.0–30.0)

## 2013-09-24 ENCOUNTER — Ambulatory Visit: Payer: Self-pay | Admitting: Infectious Disease

## 2013-09-27 ENCOUNTER — Telehealth: Payer: Self-pay | Admitting: Licensed Clinical Social Worker

## 2013-09-27 NOTE — Telephone Encounter (Signed)
Patient no showed for appointment on 10/6, I called to let him know that he will now have to walk in during the no show clinic since he has missed more than 3 appointments recently. His phone was not accepting calls.

## 2013-10-25 ENCOUNTER — Other Ambulatory Visit: Payer: Self-pay

## 2013-11-26 ENCOUNTER — Encounter: Payer: Self-pay | Admitting: Infectious Disease

## 2013-11-26 ENCOUNTER — Ambulatory Visit (INDEPENDENT_AMBULATORY_CARE_PROVIDER_SITE_OTHER): Payer: BC Managed Care – PPO | Admitting: Infectious Disease

## 2013-11-26 VITALS — BP 149/85 | HR 86 | Temp 98.1°F | Ht 76.0 in | Wt 222.5 lb

## 2013-11-26 DIAGNOSIS — B20 Human immunodeficiency virus [HIV] disease: Secondary | ICD-10-CM

## 2013-11-26 DIAGNOSIS — K029 Dental caries, unspecified: Secondary | ICD-10-CM

## 2013-11-26 DIAGNOSIS — E0590 Thyrotoxicosis, unspecified without thyrotoxic crisis or storm: Secondary | ICD-10-CM

## 2013-11-26 DIAGNOSIS — R5381 Other malaise: Secondary | ICD-10-CM

## 2013-11-26 DIAGNOSIS — I10 Essential (primary) hypertension: Secondary | ICD-10-CM

## 2013-11-26 DIAGNOSIS — J45909 Unspecified asthma, uncomplicated: Secondary | ICD-10-CM

## 2013-11-26 MED ORDER — FLUNISOLIDE HFA 80 MCG/ACT IN AERS: 1.0000 | INHALATION_SPRAY | Freq: Two times a day (BID) | RESPIRATORY_TRACT | Status: DC

## 2013-11-26 NOTE — Progress Notes (Signed)
  Subjective:    Patient ID: Hayden Johns, male    DOB: 1981-02-17, 32 y.o.   MRN: [id number]  HPI   32 y.o. male [place] with 184V mutation present recently changed to PRezista, norvir and truvada once daily for dose simplification with nice virological suppression and healthy CD4 count.  He is taking wellbutrin for depression but still anxious from job.  He is suffering from great deal of trouble from fatigue. He had TSH that was actually low in 2011 but never rechecked  He has stopped smoking  He went to the dentist here at [facility] today and had 2 fillings placed today.   He is taking albuterol 3x a week largely at work.     Review of Systems  Constitutional: Negative for fever, chills, diaphoresis, activity change, appetite change, fatigue and unexpected weight change.  HENT: Negative for congestion, rhinorrhea, sinus pressure, sneezing, sore throat and trouble swallowing.   Eyes: Negative for photophobia and visual disturbance.  Respiratory: Negative for cough, chest tightness, shortness of breath, wheezing and stridor.   Cardiovascular: Negative for chest pain, palpitations and leg swelling.  Gastrointestinal: Negative for nausea, vomiting, abdominal pain, diarrhea, constipation, blood in stool, abdominal distention and anal bleeding.  Genitourinary: Negative for dysuria, hematuria, flank pain and difficulty urinating.  Musculoskeletal: Negative for arthralgias, back pain, gait problem, joint swelling and myalgias.  Skin: Negative for color change, pallor, rash and wound.  Neurological: Negative for dizziness, tremors, weakness and light-headedness.  Hematological: Negative for adenopathy. Does not bruise/bleed easily.  Psychiatric/Behavioral: Negative for behavioral problems, confusion, sleep disturbance, dysphoric mood, decreased concentration and agitation.       Objective:   Physical Exam  Constitutional: He is oriented to person, place, and time. He  appears well-developed and well-nourished. No distress.  HENT:  Head: Normocephalic and atraumatic.  Mouth/Throat: Oropharynx is clear and moist. No oropharyngeal exudate.  Eyes: Conjunctivae and EOM are normal. Pupils are equal, round, and reactive to light. No scleral icterus.  Neck: Normal range of motion. Neck supple. No JVD present.  Cardiovascular: Normal rate, regular rhythm and normal heart sounds.  Exam reveals no gallop and no friction rub.   No murmur heard. Pulmonary/Chest: Effort normal and breath sounds normal. No respiratory distress. He has no wheezes. He has no rales. He exhibits no tenderness.  Abdominal: He exhibits no distension and no mass. There is no tenderness. There is no rebound and no guarding.  Musculoskeletal: He exhibits no edema and no tenderness.  Lymphadenopathy:    He has no cervical adenopathy.  Neurological: He is alert and oriented to person, place, and time. He has normal reflexes. He exhibits normal muscle tone. Coordination normal.  Skin: Skin is warm and dry. He is not diaphoretic. No erythema. No pallor.  Psychiatric: He has a normal mood and affect. His behavior is normal. Judgment and thought content normal.          Assessment & Plan:  HIV: continue Prezista, norvir and truavada, check labs today and rtc in 4 months for recheck labs. Will be able to consolidate to STR when TAF/EMT/COBI/PREZISTA is here.    Caries: filled  ASthma: start flunisolide. CANNOT be on fluticasone due to drug drug intteraction with Norvir  Fatigue: check TSH, rx depression as noted he had slightly suppressed TSH when checked in 2011, could possibly be on   Depression: taking wellbutrin, adding in klonopin  HTN: could be partly due to anxiety but likely will need anti HTN, keep BP log

## 2013-11-27 LAB — HIV-1 RNA QUANT-NO REFLEX-BLD
HIV 1 RNA Quant: <20 copies/mL (ref <20)
HIV-1 RNA Quant, Log: <1.3 {Log} (ref <1.30)

## 2013-12-12 ENCOUNTER — Telehealth: Payer: Self-pay | Admitting: *Deleted

## 2013-12-12 NOTE — Telephone Encounter (Signed)
Patient called c/o sore throat and I advised him to try gargling with warm salt water. He said he did not know what to take for a cold that would not interfere with his depression medicine. Advised if he does get chest congestion he can try Mucinex D. Wendall Mola

## 2014-01-02 ENCOUNTER — Other Ambulatory Visit: Payer: Self-pay | Admitting: *Deleted

## 2014-01-02 DIAGNOSIS — B20 Human immunodeficiency virus [HIV] disease: Secondary | ICD-10-CM

## 2014-01-02 MED ORDER — EMTRICITABINE-TENOFOVIR DF 200-300 MG PO TABS: 1.0000 | ORAL_TABLET | Freq: Every day | ORAL | Status: DC

## 2014-01-07 ENCOUNTER — Other Ambulatory Visit: Payer: Self-pay | Admitting: *Deleted

## 2014-01-07 ENCOUNTER — Telehealth: Payer: Self-pay | Admitting: *Deleted

## 2014-01-07 DIAGNOSIS — B20 Human immunodeficiency virus [HIV] disease: Secondary | ICD-10-CM

## 2014-01-07 MED ORDER — EMTRICITABINE-TENOFOVIR DF 200-300 MG PO TABS: 1.0000 | ORAL_TABLET | Freq: Every day | ORAL | Status: DC

## 2014-01-07 MED ORDER — DARUNAVIR ETHANOLATE 800 MG PO TABS: 800.0000 mg | ORAL_TABLET | Freq: Every day | ORAL | Status: DC

## 2014-01-07 MED ORDER — RITONAVIR 100 MG PO TABS
100.0000 mg | ORAL_TABLET | Freq: Every day | ORAL | Status: DC
Start: 2014-01-07 — End: 2014-06-17

## 2014-01-07 MED ORDER — RITONAVIR 100 MG PO TABS
100.0000 mg | ORAL_TABLET | Freq: Every day | ORAL | Status: DC
Start: 2014-01-07 — End: 2014-01-07

## 2014-01-07 NOTE — Telephone Encounter (Signed)
Patient now has insurance, was confused about where he could pick up medications.  RN called [facility] on [address]. Patient is supposed to use the specialty [facility] in [place].  RN sent prescriptions there, notified patient that he would have to call, give them his insurance/copay card information, set up delivery.  Patient took phone number [phone]), will contact them.  This will cause a gap in medications, as he waited until he was out of medication to inquire about future refills. Patient aware. Andree CossHowell, Purvi Ruehl M, RN

## 2014-04-09 ENCOUNTER — Other Ambulatory Visit: Payer: BC Managed Care – PPO

## 2014-04-23 ENCOUNTER — Telehealth: Payer: Self-pay | Admitting: *Deleted

## 2014-04-23 ENCOUNTER — Ambulatory Visit: Payer: BC Managed Care – PPO | Admitting: Infectious Disease

## 2014-04-23 NOTE — Telephone Encounter (Signed)
Will need a new appt made.  Referred to the Receptionist for appt.

## 2014-05-02 ENCOUNTER — Other Ambulatory Visit: Payer: Self-pay | Admitting: Licensed Clinical Social Worker

## 2014-05-02 DIAGNOSIS — R0989 Other specified symptoms and signs involving the circulatory and respiratory systems: Secondary | ICD-10-CM

## 2014-05-02 DIAGNOSIS — F32A Depression, unspecified: Secondary | ICD-10-CM

## 2014-05-02 DIAGNOSIS — F329 Major depressive disorder, single episode, unspecified: Secondary | ICD-10-CM

## 2014-05-02 MED ORDER — OMEPRAZOLE MAGNESIUM 20 MG PO TBEC: 20.0000 mg | DELAYED_RELEASE_TABLET | Freq: Every day | ORAL | Status: DC

## 2014-05-02 MED ORDER — BUPROPION HCL ER (XL) 150 MG PO TB24: 150.0000 mg | ORAL_TABLET | Freq: Two times a day (BID) | ORAL | Status: DC

## 2014-05-03 ENCOUNTER — Other Ambulatory Visit: Payer: Self-pay | Admitting: *Deleted

## 2014-05-03 DIAGNOSIS — F329 Major depressive disorder, single episode, unspecified: Secondary | ICD-10-CM

## 2014-05-03 DIAGNOSIS — F32A Depression, unspecified: Secondary | ICD-10-CM

## 2014-05-03 MED ORDER — BUPROPION HCL ER (XL) 150 MG PO TB24: 150.0000 mg | ORAL_TABLET | Freq: Two times a day (BID) | ORAL | Status: DC

## 2014-05-07 ENCOUNTER — Other Ambulatory Visit (INDEPENDENT_AMBULATORY_CARE_PROVIDER_SITE_OTHER): Payer: BC Managed Care – PPO

## 2014-05-07 DIAGNOSIS — I10 Essential (primary) hypertension: Secondary | ICD-10-CM

## 2014-05-07 DIAGNOSIS — K029 Dental caries, unspecified: Secondary | ICD-10-CM

## 2014-05-07 DIAGNOSIS — R5383 Other fatigue: Secondary | ICD-10-CM

## 2014-05-07 DIAGNOSIS — B20 Human immunodeficiency virus [HIV] disease: Secondary | ICD-10-CM

## 2014-05-07 DIAGNOSIS — J45909 Unspecified asthma, uncomplicated: Secondary | ICD-10-CM

## 2014-05-07 DIAGNOSIS — E0590 Thyrotoxicosis, unspecified without thyrotoxic crisis or storm: Secondary | ICD-10-CM

## 2014-05-07 DIAGNOSIS — R5381 Other malaise: Secondary | ICD-10-CM

## 2014-05-07 LAB — LIPID PANEL
CHOLESTEROL: 108 mg/dL (ref 0–200)
HDL: 36 mg/dL — AB (ref >39)
LDL Cholesterol: 42 mg/dL (ref 0–99)
TRIGLYCERIDES: 149 mg/dL (ref <150)
Total CHOL/HDL Ratio: 3 Ratio
VLDL: 30 mg/dL (ref 0–40)

## 2014-05-07 LAB — CBC WITH DIFFERENTIAL/PLATELET
Basophils Absolute: 0 10*3/uL (ref 0.0–0.1)
Basophils Relative: 1 % (ref 0–1)
EOS ABS: 0.2 10*3/uL (ref 0.0–0.7)
EOS PCT: 5 % (ref 0–5)
HCT: 41.6 % (ref 39.0–52.0)
Hemoglobin: 14.6 g/dL (ref 13.0–17.0)
LYMPHS ABS: 2.2 10*3/uL (ref 0.7–4.0)
Lymphocytes Relative: 49 % — ABNORMAL HIGH (ref 12–46)
MCH: 28.6 pg (ref 26.0–34.0)
MCHC: 35.1 g/dL (ref 30.0–36.0)
MCV: 81.6 fL (ref 78.0–100.0)
MONO ABS: 0.3 10*3/uL (ref 0.1–1.0)
Monocytes Relative: 7 % (ref 3–12)
Neutro Abs: 1.7 10*3/uL (ref 1.7–7.7)
Neutrophils Relative %: 38 % — ABNORMAL LOW (ref 43–77)
PLATELETS: 305 10*3/uL (ref 150–400)
RBC: 5.1 MIL/uL (ref 4.22–5.81)
RDW: 13.7 % (ref 11.5–15.5)
WBC: 4.4 10*3/uL (ref 4.0–10.5)

## 2014-05-07 LAB — COMPLETE METABOLIC PANEL WITH GFR
ALT: 28 U/L (ref 0–53)
AST: 28 U/L (ref 0–37)
Albumin: 4.4 g/dL (ref 3.5–5.2)
Alkaline Phosphatase: 77 U/L (ref 39–117)
BILIRUBIN TOTAL: 0.4 mg/dL (ref 0.2–1.2)
BUN: 11 mg/dL (ref 6–23)
CO2: 26 meq/L (ref 19–32)
CREATININE: 0.97 mg/dL (ref 0.50–1.35)
Calcium: 9 mg/dL (ref 8.4–10.5)
Chloride: 104 mEq/L (ref 96–112)
GLUCOSE: 92 mg/dL (ref 70–99)
Potassium: 4 mEq/L (ref 3.5–5.3)
Sodium: 139 mEq/L (ref 135–145)
Total Protein: 7.1 g/dL (ref 6.0–8.3)

## 2014-05-07 LAB — RPR

## 2014-05-08 LAB — T-HELPER CELL (CD4) - (RCID CLINIC ONLY)
CD4 T CELL HELPER: 24 % — AB (ref 33–55)
CD4 T Cell Abs: 520 /uL (ref 400–2700)

## 2014-05-08 LAB — HIV-1 RNA QUANT-NO REFLEX-BLD
HIV 1 RNA Quant: <20 copies/mL (ref <20)
HIV-1 RNA Quant, Log: <1.3 {Log} (ref <1.30)

## 2014-05-22 ENCOUNTER — Telehealth: Payer: Self-pay | Admitting: *Deleted

## 2014-05-22 NOTE — Telephone Encounter (Signed)
Received fax from [facility].  Bupropion rx must be filled at a local pharmacy.  Left message for pt to call office to let [facility] know where to send this rx.

## 2014-06-17 ENCOUNTER — Ambulatory Visit (INDEPENDENT_AMBULATORY_CARE_PROVIDER_SITE_OTHER): Payer: BC Managed Care – PPO | Admitting: Infectious Disease

## 2014-06-17 ENCOUNTER — Encounter: Payer: Self-pay | Admitting: Infectious Disease

## 2014-06-17 VITALS — BP 152/96 | HR 87 | Temp 98.7°F | Wt 225.0 lb

## 2014-06-17 DIAGNOSIS — I10 Essential (primary) hypertension: Secondary | ICD-10-CM

## 2014-06-17 DIAGNOSIS — F329 Major depressive disorder, single episode, unspecified: Secondary | ICD-10-CM

## 2014-06-17 DIAGNOSIS — B20 Human immunodeficiency virus [HIV] disease: Secondary | ICD-10-CM

## 2014-06-17 DIAGNOSIS — F3289 Other specified depressive episodes: Secondary | ICD-10-CM

## 2014-06-17 DIAGNOSIS — J45901 Unspecified asthma with (acute) exacerbation: Secondary | ICD-10-CM

## 2014-06-17 DIAGNOSIS — R5381 Other malaise: Secondary | ICD-10-CM

## 2014-06-17 DIAGNOSIS — R5383 Other fatigue: Secondary | ICD-10-CM

## 2014-06-17 DIAGNOSIS — F32A Depression, unspecified: Secondary | ICD-10-CM

## 2014-06-17 DIAGNOSIS — J4521 Mild intermittent asthma with (acute) exacerbation: Secondary | ICD-10-CM

## 2014-06-17 MED ORDER — ESCITALOPRAM OXALATE 10 MG PO TABS: 10.0000 mg | ORAL_TABLET | Freq: Every day | ORAL | Status: DC

## 2014-06-17 MED ORDER — DARUNAVIR-COBICISTAT 800-150 MG PO TABS: 1.0000 | ORAL_TABLET | Freq: Every day | ORAL | Status: DC

## 2014-06-17 NOTE — Patient Instructions (Signed)
We are going to change you to the following regimen:  PREZCOBIX ONE PINK PILL ONCE DAILY  WITH   TRUVADA ONE BLUE TABLET ONCE DAILY   I WOULD LIKE TO HAVE YOU COME BACK IN 4 MONTHS FOR REPEAT BLOOD WORK  THANKS FOR TAKING SUCH GOOD CARE OF YOURSELF!

## 2014-06-17 NOTE — Progress Notes (Signed)
   Subjective:    Patient ID: Hayden Johns, male    DOB: April 11, 1981, 33 y.o.   MRN: [id number]  HPI  33 y.o. male African american with 184V mutation present recently changed to PRezista, norvir and truvada once daily for dose simplification with nice virological suppression and healthy CD4 count.  He is taking wellbutrin for depression but still suffering from fatigue and lack of energy.   Review of Systems  Constitutional: Positive for activity change. Negative for fever, chills, diaphoresis, appetite change, fatigue and unexpected weight change.  HENT: Negative for congestion, rhinorrhea, sinus pressure, sneezing, sore throat and trouble swallowing.   Eyes: Negative for photophobia and visual disturbance.  Respiratory: Negative for cough, chest tightness, shortness of breath, wheezing and stridor.   Cardiovascular: Negative for chest pain, palpitations and leg swelling.  Gastrointestinal: Negative for nausea, vomiting, abdominal pain, diarrhea, constipation, blood in stool, abdominal distention and anal bleeding.  Genitourinary: Negative for dysuria, hematuria, flank pain and difficulty urinating.  Musculoskeletal: Negative for arthralgias, back pain, gait problem, joint swelling and myalgias.  Skin: Negative for color change, pallor, rash and wound.  Neurological: Negative for dizziness, tremors, weakness and light-headedness.  Hematological: Negative for adenopathy. Does not bruise/bleed easily.  Psychiatric/Behavioral: Positive for decreased concentration. Negative for behavioral problems, confusion, sleep disturbance, dysphoric mood and agitation.       Objective:   Physical Exam  Constitutional: He appears well-developed and well-nourished. No distress.  HENT:  Head: Normocephalic and atraumatic.  Mouth/Throat: Oropharynx is clear and moist. No oropharyngeal exudate.  Eyes: Conjunctivae and EOM are normal. No scleral icterus.  Neck: Normal range of motion. Neck  supple.  Cardiovascular: Normal rate and regular rhythm.   Pulmonary/Chest: Effort normal and breath sounds normal. No respiratory distress. He has no wheezes.  Abdominal: Soft. He exhibits no distension. There is no tenderness.  Musculoskeletal: He exhibits no edema and no tenderness.  Neurological: He is alert. He has normal reflexes. He exhibits normal muscle tone. Coordination normal.  Skin: Skin is warm and dry. He is not diaphoretic. No erythema. No pallor.  Psychiatric: He has a normal mood and affect. His behavior is normal. Judgment and thought content normal.          Assessment & Plan:   HIV: simplify to Prezcobix and truvada   Will be able to consolidate to STR when TAF/EMT/COBI/PREZISTA is here.   I spent greater than 25 minutes with the patient including greater than 50% of time in face to face counsel of the patient and in coordination of their care.  ASthma: continue flunisolide. CANNOT be on fluticasone due to drug drug intteraction with Norvir  Fatigue, likely due to Depression: change to lexapro. Not taking the klonopin now  HTN: not well controlled. Will need to start a medicine next appt

## 2014-06-19 ENCOUNTER — Other Ambulatory Visit: Payer: Self-pay | Admitting: *Deleted

## 2014-06-19 DIAGNOSIS — B20 Human immunodeficiency virus [HIV] disease: Secondary | ICD-10-CM

## 2014-06-19 MED ORDER — EMTRICITABINE-TENOFOVIR DF 200-300 MG PO TABS: 1.0000 | ORAL_TABLET | Freq: Every day | ORAL | Status: DC

## 2014-06-19 MED ORDER — DARUNAVIR-COBICISTAT 800-150 MG PO TABS: 1.0000 | ORAL_TABLET | Freq: Every day | ORAL | Status: DC

## 2014-06-19 NOTE — Telephone Encounter (Signed)
Pt's specialty pharmacy has changed to [facility].  Prescriptions for HIV medications called to [facility].  Pt needs to call [facility] to set up an account.  Left [phone] as well as number on the back of his [employer] card to call to set up account.

## 2014-06-20 ENCOUNTER — Telehealth: Payer: Self-pay | Admitting: *Deleted

## 2014-06-20 NOTE — Telephone Encounter (Signed)
Pt reports feeling  "stoned" with 10 mg lexapro.  Wants to start off with 5mg  (cutting the 10mg  in half) for a few weeks, then go to 10mg  if he still feels symptomatic. Andree CossHowell, Marlene Beidler M, RN

## 2014-06-20 NOTE — Telephone Encounter (Signed)
That is fine 

## 2014-09-30 ENCOUNTER — Other Ambulatory Visit (INDEPENDENT_AMBULATORY_CARE_PROVIDER_SITE_OTHER): Payer: BC Managed Care – PPO

## 2014-09-30 DIAGNOSIS — B20 Human immunodeficiency virus [HIV] disease: Secondary | ICD-10-CM

## 2014-09-30 LAB — COMPLETE METABOLIC PANEL WITH GFR
ALBUMIN: 4.5 g/dL (ref 3.5–5.2)
ALT: 30 U/L (ref 0–53)
AST: 26 U/L (ref 0–37)
Alkaline Phosphatase: 87 U/L (ref 39–117)
BUN: 15 mg/dL (ref 6–23)
CO2: 22 mEq/L (ref 19–32)
Calcium: 9.3 mg/dL (ref 8.4–10.5)
Chloride: 103 mEq/L (ref 96–112)
Creat: 1.08 mg/dL (ref 0.50–1.35)
GFR, EST NON AFRICAN AMERICAN: 89 mL/min
GFR, Est African American: >89 mL/min
GLUCOSE: 124 mg/dL — AB (ref 70–99)
POTASSIUM: 4.2 meq/L (ref 3.5–5.3)
Sodium: 138 mEq/L (ref 135–145)
Total Bilirubin: 0.3 mg/dL (ref 0.2–1.2)
Total Protein: 7.3 g/dL (ref 6.0–8.3)

## 2014-09-30 LAB — CBC WITH DIFFERENTIAL/PLATELET
BASOS PCT: 0 % (ref 0–1)
Basophils Absolute: 0 10*3/uL (ref 0.0–0.1)
EOS ABS: 0.3 10*3/uL (ref 0.0–0.7)
EOS PCT: 5 % (ref 0–5)
HCT: 43.3 % (ref 39.0–52.0)
Hemoglobin: 14.8 g/dL (ref 13.0–17.0)
LYMPHS ABS: 2.7 10*3/uL (ref 0.7–4.0)
Lymphocytes Relative: 54 % — ABNORMAL HIGH (ref 12–46)
MCH: 28.5 pg (ref 26.0–34.0)
MCHC: 34.2 g/dL (ref 30.0–36.0)
MCV: 83.3 fL (ref 78.0–100.0)
Monocytes Absolute: 0.4 10*3/uL (ref 0.1–1.0)
Monocytes Relative: 8 % (ref 3–12)
Neutro Abs: 1.7 10*3/uL (ref 1.7–7.7)
Neutrophils Relative %: 33 % — ABNORMAL LOW (ref 43–77)
Platelets: 271 10*3/uL (ref 150–400)
RBC: 5.2 MIL/uL (ref 4.22–5.81)
RDW: 13.9 % (ref 11.5–15.5)
WBC: 5 10*3/uL (ref 4.0–10.5)

## 2014-09-30 LAB — LIPID PANEL
Cholesterol: 115 mg/dL (ref 0–200)
HDL: 38 mg/dL — ABNORMAL LOW (ref >39)
LDL Cholesterol: 26 mg/dL (ref 0–99)
Total CHOL/HDL Ratio: 3 Ratio
Triglycerides: 254 mg/dL — ABNORMAL HIGH (ref <150)
VLDL: 51 mg/dL — ABNORMAL HIGH (ref 0–40)

## 2014-10-01 LAB — RPR

## 2014-10-01 LAB — HIV-1 RNA QUANT-NO REFLEX-BLD
HIV 1 RNA Quant: <20 copies/mL (ref <20)
HIV-1 RNA Quant, Log: <1.3 {Log} (ref <1.30)

## 2014-10-01 LAB — T-HELPER CELL (CD4) - (RCID CLINIC ONLY)
CD4 % Helper T Cell: 25 % — ABNORMAL LOW (ref 33–55)
CD4 T CELL ABS: 720 /uL (ref 400–2700)

## 2014-10-10 ENCOUNTER — Ambulatory Visit: Payer: BC Managed Care – PPO | Admitting: Infectious Disease

## 2014-10-14 ENCOUNTER — Ambulatory Visit: Payer: BC Managed Care – PPO | Admitting: Infectious Disease

## 2014-11-05 ENCOUNTER — Ambulatory Visit: Payer: BC Managed Care – PPO | Admitting: Infectious Disease

## 2014-11-19 ENCOUNTER — Ambulatory Visit (INDEPENDENT_AMBULATORY_CARE_PROVIDER_SITE_OTHER): Payer: BC Managed Care – PPO | Admitting: Infectious Disease

## 2014-11-19 ENCOUNTER — Encounter: Payer: Self-pay | Admitting: Infectious Disease

## 2014-11-19 VITALS — BP 152/81 | HR 87 | Temp 98.7°F | Wt 231.4 lb

## 2014-11-19 DIAGNOSIS — F411 Generalized anxiety disorder: Secondary | ICD-10-CM | POA: Diagnosis not present

## 2014-11-19 DIAGNOSIS — B20 Human immunodeficiency virus [HIV] disease: Secondary | ICD-10-CM

## 2014-11-19 DIAGNOSIS — F330 Major depressive disorder, recurrent, mild: Secondary | ICD-10-CM

## 2014-11-19 DIAGNOSIS — R05 Cough: Secondary | ICD-10-CM

## 2014-11-19 DIAGNOSIS — R059 Cough, unspecified: Secondary | ICD-10-CM

## 2014-11-19 DIAGNOSIS — J4520 Mild intermittent asthma, uncomplicated: Secondary | ICD-10-CM

## 2014-11-19 DIAGNOSIS — G4700 Insomnia, unspecified: Secondary | ICD-10-CM

## 2014-11-19 MED ORDER — ALBUTEROL SULFATE HFA 108 (90 BASE) MCG/ACT IN AERS: 2.0000 | INHALATION_SPRAY | Freq: Four times a day (QID) | RESPIRATORY_TRACT | Status: DC | PRN

## 2014-11-19 NOTE — Progress Notes (Signed)
   Subjective:    Patient ID: Hayden Johns, male    DOB: November 02, 1981, 33 y.o.   MRN: [id number]  HPI   33 y.o. male [place] with 184V mutation present recently changed to PRezista, norvir and truvada and then PREZCOBIX  and TRUVADA for dose simplification with nice virological suppression and healthy CD4 count.  Lab Results  Component Value Date   HIV1RNAQUANT <20 09/30/2014   Lab Results  Component Value Date   CD4TABS 720 09/30/2014   CD4TABS 520 05/07/2014   CD4TABS 610 09/17/2013     He states that he is occasionally smoking cigarettes when he works 3 days a week and when he is stressed.  He states he hardly ever uses albuterol metered-dose inhaler and has lost his flunisolide inhaler.  He takes his selective serotonin reuptake inhibitor 3 days a week but the other days.  He is no longer on clonazepam and states that the Ambien he was taking before for sleep does not help him   Review of Systems  Constitutional: Negative for fever, chills, diaphoresis, appetite change, fatigue and unexpected weight change.  HENT: Negative for congestion, rhinorrhea, sinus pressure, sneezing, sore throat and trouble swallowing.   Eyes: Negative for photophobia and visual disturbance.  Respiratory: Negative for cough, chest tightness, shortness of breath, wheezing and stridor.   Cardiovascular: Negative for chest pain, palpitations and leg swelling.  Gastrointestinal: Negative for nausea, vomiting, abdominal pain, diarrhea, constipation, blood in stool, abdominal distention and anal bleeding.  Genitourinary: Negative for dysuria, hematuria, flank pain and difficulty urinating.  Musculoskeletal: Negative for myalgias, back pain, joint swelling, arthralgias and gait problem.  Skin: Negative for color change, pallor, rash and wound.  Neurological: Negative for dizziness, tremors, weakness and light-headedness.  Hematological: Negative for adenopathy. Does not bruise/bleed  easily.  Psychiatric/Behavioral: Positive for sleep disturbance. Negative for behavioral problems, confusion, dysphoric mood and agitation. The patient is nervous/anxious.        Objective:   Physical Exam         Assessment & Plan:   HIV: Continue Prezcobix and truvada Will be able to consolidate to STR when TAF/EMT/COBI/PREZISTA is here.   I spent greater than 25 minutes with the patient including greater than 50% of time in face to face counsel of the patient and in coordination of their care.  ASthma: He is rarely using his albuterol metered-dose inhaler and so may not need much in the way of a maintenance steroid  Depression: Continue lexapro.   HTN: not well controlled. I will give him 6 more months to bring this under control and to give me more data re his home and work BP readings. Them with regards to lifestyle changes including reducing salt intake  Smoker: I spent greater than 3 minutes also with regards to smoking cessation including encouraging him to try to cure routine gum.

## 2014-11-25 ENCOUNTER — Telehealth: Payer: Self-pay | Admitting: *Deleted

## 2014-11-25 NOTE — Telephone Encounter (Addendum)
Needing PA for Proventil rx completed.   Obtained paperwork, completed and Dr. Daiva EvesVan Dam signed.  Needing MD's BCBS ID number to finish paperwork [place].  MD's BC[employer] # obtained, written on PA and [facility]26[facility].  Waiting on response.

## 2014-11-28 ENCOUNTER — Telehealth: Payer: Self-pay | Admitting: *Deleted

## 2014-11-28 NOTE — Telephone Encounter (Signed)
Received notice that the Proventil prescription was denied by insurance.  Insurance is requiring that the patient first try ProAir HFA.  Please advise. Andree CossHowell, Michelle M, RN

## 2015-05-26 ENCOUNTER — Other Ambulatory Visit: Payer: BLUE CROSS/BLUE SHIELD

## 2015-05-26 DIAGNOSIS — B20 Human immunodeficiency virus [HIV] disease: Secondary | ICD-10-CM

## 2015-05-27 LAB — COMPLETE METABOLIC PANEL WITH GFR
ALT: 16 U/L (ref 0–53)
AST: 24 U/L (ref 0–37)
Albumin: 4.7 g/dL (ref 3.5–5.2)
Alkaline Phosphatase: 73 U/L (ref 39–117)
BUN: 17 mg/dL (ref 6–23)
CHLORIDE: 104 meq/L (ref 96–112)
CO2: 27 meq/L (ref 19–32)
Calcium: 9.9 mg/dL (ref 8.4–10.5)
Creat: 1.15 mg/dL (ref 0.50–1.35)
GFR, Est Non African American: 82 mL/min
GLUCOSE: 89 mg/dL (ref 70–99)
Potassium: 4.6 mEq/L (ref 3.5–5.3)
SODIUM: 139 meq/L (ref 135–145)
Total Bilirubin: 0.5 mg/dL (ref 0.2–1.2)
Total Protein: 8 g/dL (ref 6.0–8.3)

## 2015-05-27 LAB — CBC WITH DIFFERENTIAL/PLATELET
BASOS PCT: 1 % (ref 0–1)
Basophils Absolute: 0 10*3/uL (ref 0.0–0.1)
Eosinophils Absolute: 0.2 10*3/uL (ref 0.0–0.7)
Eosinophils Relative: 5 % (ref 0–5)
HEMATOCRIT: 46 % (ref 39.0–52.0)
HEMOGLOBIN: 15.8 g/dL (ref 13.0–17.0)
LYMPHS ABS: 2.2 10*3/uL (ref 0.7–4.0)
Lymphocytes Relative: 50 % — ABNORMAL HIGH (ref 12–46)
MCH: 28.5 pg (ref 26.0–34.0)
MCHC: 34.3 g/dL (ref 30.0–36.0)
MCV: 82.9 fL (ref 78.0–100.0)
MONOS PCT: 9 % (ref 3–12)
MPV: 9.5 fL (ref 8.6–12.4)
Monocytes Absolute: 0.4 10*3/uL (ref 0.1–1.0)
Neutro Abs: 1.5 10*3/uL — ABNORMAL LOW (ref 1.7–7.7)
Neutrophils Relative %: 35 % — ABNORMAL LOW (ref 43–77)
Platelets: 269 10*3/uL (ref 150–400)
RBC: 5.55 MIL/uL (ref 4.22–5.81)
RDW: 13.9 % (ref 11.5–15.5)
WBC: 4.4 10*3/uL (ref 4.0–10.5)

## 2015-05-27 LAB — LIPID PANEL
Cholesterol: 120 mg/dL (ref 0–200)
HDL: 43 mg/dL (ref >=40)
LDL Cholesterol: 64 mg/dL (ref 0–99)
Total CHOL/HDL Ratio: 2.8 Ratio
Triglycerides: 64 mg/dL (ref <150)
VLDL: 13 mg/dL (ref 0–40)

## 2015-05-27 LAB — HEPATITIS C ANTIBODY: HCV Ab: NEGATIVE

## 2015-05-27 LAB — RPR

## 2015-05-28 LAB — HIV-1 RNA QUANT-NO REFLEX-BLD: HIV-1 RNA Quant, Log: <1.3 {Log} (ref <1.30)

## 2015-05-28 LAB — T-HELPER CELL (CD4) - (RCID CLINIC ONLY)
CD4 T CELL ABS: 550 /uL (ref 400–2700)
CD4 T CELL HELPER: 23 % — AB (ref 33–55)

## 2015-06-09 ENCOUNTER — Ambulatory Visit: Payer: Self-pay | Admitting: Infectious Disease

## 2015-07-29 ENCOUNTER — Other Ambulatory Visit: Payer: Self-pay | Admitting: Licensed Clinical Social Worker

## 2015-07-29 ENCOUNTER — Other Ambulatory Visit: Payer: Self-pay | Admitting: Infectious Disease

## 2015-07-29 MED ORDER — ESCITALOPRAM OXALATE 10 MG PO TABS: 10.0000 mg | ORAL_TABLET | Freq: Every day | ORAL | Status: DC

## 2015-07-29 MED ORDER — EMTRICITABINE-TENOFOVIR DF 200-300 MG PO TABS: 1.0000 | ORAL_TABLET | Freq: Every day | ORAL | Status: DC

## 2015-07-29 MED ORDER — DARUNAVIR-COBICISTAT 800-150 MG PO TABS: 1.0000 | ORAL_TABLET | Freq: Every day | ORAL | Status: DC

## 2015-07-29 NOTE — Telephone Encounter (Signed)
error 

## 2015-08-07 ENCOUNTER — Encounter: Payer: Self-pay | Admitting: Infectious Disease

## 2015-08-07 ENCOUNTER — Ambulatory Visit (INDEPENDENT_AMBULATORY_CARE_PROVIDER_SITE_OTHER): Payer: Managed Care, Other (non HMO) | Admitting: Infectious Disease

## 2015-08-07 VITALS — BP 135/85 | HR 79 | Temp 97.5°F | Ht 78.0 in | Wt 231.8 lb

## 2015-08-07 DIAGNOSIS — Z720 Tobacco use: Secondary | ICD-10-CM | POA: Diagnosis not present

## 2015-08-07 DIAGNOSIS — B20 Human immunodeficiency virus [HIV] disease: Secondary | ICD-10-CM

## 2015-08-07 DIAGNOSIS — J4520 Mild intermittent asthma, uncomplicated: Secondary | ICD-10-CM | POA: Diagnosis not present

## 2015-08-07 DIAGNOSIS — F331 Major depressive disorder, recurrent, moderate: Secondary | ICD-10-CM | POA: Diagnosis not present

## 2015-08-07 DIAGNOSIS — F17200 Nicotine dependence, unspecified, uncomplicated: Secondary | ICD-10-CM

## 2015-08-07 MED ORDER — DARUNAVIR-COBICISTAT 800-150 MG PO TABS: 1.0000 | ORAL_TABLET | Freq: Every day | ORAL | Status: DC

## 2015-08-07 MED ORDER — EMTRICITABINE-TENOFOVIR AF 200-25 MG PO TABS: 1.0000 | ORAL_TABLET | Freq: Every day | ORAL | Status: DC

## 2015-08-07 NOTE — Progress Notes (Unsigned)
Patient ID: Hayden Johns, male   DOB: 30-Nov-1981, 34 y.o.   MRN: [id number] I met with pt. Very briefly to introduce myself and let him know of my services here at the clinic.  He was pleasant and agreed to schedule an appointment. Curley Spice, LCSW

## 2015-08-07 NOTE — Progress Notes (Signed)
   Subjective:    Patient ID: Hayden Johns, male    DOB: 1981/02/03, 34 y.o.   MRN: [id number]  HPI   34 y.o. male [place] with 184V mutation present recently changed to PRezista, norvir and truvada and then PREZCOBIX  and TRUVADA for dose simplification with nice virological suppression and healthy CD4 count.  Lab Results  Component Value Date   HIV1RNAQUANT <20 05/26/2015   Lab Results  Component Value Date   CD4TABS 550 05/26/2015   CD4TABS 720 09/30/2014   CD4TABS 520 05/07/2014     He states that he is occasionally smoking cigarettes when he works 3 days a week and when he is stressed.  He states he hardly ever uses albuterol metered-dose inhaler and has lost his flunisolide inhaler.  He takes his selective serotonin reuptake inhibitor but does not have anyone to help him with counseling. Sue Lush see him to [employer] today.    Review of Systems  Constitutional: Negative for fever, chills, diaphoresis, appetite change, fatigue and unexpected weight change.  HENT: Negative for congestion, rhinorrhea, sinus pressure, sneezing, sore throat and trouble swallowing.   Eyes: Negative for photophobia and visual disturbance.  Respiratory: Negative for cough, chest tightness, shortness of breath, wheezing and stridor.   Cardiovascular: Negative for chest pain, palpitations and leg swelling.  Gastrointestinal: Negative for nausea, vomiting, abdominal pain, diarrhea, constipation, blood in stool, abdominal distention and anal bleeding.  Genitourinary: Negative for dysuria, hematuria, flank pain and difficulty urinating.  Musculoskeletal: Negative for myalgias, back pain, joint swelling, arthralgias and gait problem.  Skin: Negative for color change, pallor, rash and wound.  Neurological: Negative for dizziness, tremors, weakness and light-headedness.  Hematological: Negative for adenopathy. Does not bruise/bleed easily.  Psychiatric/Behavioral: Negative for  behavioral problems, confusion, dysphoric mood and agitation.       Objective:   Physical Exam  Constitutional: He is oriented to person, place, and time. He appears well-developed and well-nourished.  HENT:  Head: Normocephalic and atraumatic.  Eyes: Conjunctivae and EOM are normal.  Neck: Normal range of motion. Neck supple.  Cardiovascular: Normal rate and regular rhythm.   Pulmonary/Chest: Effort normal. No respiratory distress. He has no wheezes.  Abdominal: Soft. He exhibits no distension.  Musculoskeletal: Normal range of motion. He exhibits no edema or tenderness.  Neurological: He is alert and oriented to person, place, and time.  Skin: Skin is warm and dry. No rash noted. No erythema. No pallor.  Psychiatric: He has a normal mood and affect. His behavior is normal. Judgment and thought content normal.          Assessment & Plan:   HIV: Continue Prezcobix and exchanged Truvada for DESCOVY     ASthma: Not using albuterol anymorestop smoking will taken off his profile  Depression: Continue lexapro, and can meet with Bernette Redbird  HTN: Blood pressure not too high today and not on meds Filed Vitals:   08/07/15 0944  BP: 135/85  Pulse: 79  Temp: 97.5 F (36.4 C)    Smoker: quit  I spent greater than 40 minutes with the patient including greater than 50% of time in face to face counsel of the patient regarding his HIV, hypertension depression asthma and in coordination of their care.

## 2015-09-04 ENCOUNTER — Encounter (HOSPITAL_BASED_OUTPATIENT_CLINIC_OR_DEPARTMENT_OTHER): Payer: Self-pay | Admitting: *Deleted

## 2015-09-04 ENCOUNTER — Emergency Department (HOSPITAL_BASED_OUTPATIENT_CLINIC_OR_DEPARTMENT_OTHER)
Admission: EM | Admit: 2015-09-04 | Discharge: 2015-09-05 | Disposition: A | Discharge status: Home / Self Care | Payer: Managed Care, Other (non HMO) | Attending: Emergency Medicine | Admitting: Emergency Medicine

## 2015-09-04 DIAGNOSIS — R590 Localized enlarged lymph nodes: Secondary | ICD-10-CM | POA: Insufficient documentation

## 2015-09-04 DIAGNOSIS — I10 Essential (primary) hypertension: Secondary | ICD-10-CM | POA: Diagnosis not present

## 2015-09-04 DIAGNOSIS — J45909 Unspecified asthma, uncomplicated: Secondary | ICD-10-CM | POA: Insufficient documentation

## 2015-09-04 DIAGNOSIS — K529 Noninfective gastroenteritis and colitis, unspecified: Secondary | ICD-10-CM | POA: Diagnosis not present

## 2015-09-04 DIAGNOSIS — Z720 Tobacco use: Secondary | ICD-10-CM | POA: Insufficient documentation

## 2015-09-04 DIAGNOSIS — Z21 Asymptomatic human immunodeficiency virus [HIV] infection status: Secondary | ICD-10-CM | POA: Diagnosis not present

## 2015-09-04 DIAGNOSIS — R591 Generalized enlarged lymph nodes: Secondary | ICD-10-CM

## 2015-09-04 DIAGNOSIS — R1030 Lower abdominal pain, unspecified: Secondary | ICD-10-CM | POA: Diagnosis present

## 2015-09-04 DIAGNOSIS — R52 Pain, unspecified: Secondary | ICD-10-CM

## 2015-09-04 DIAGNOSIS — Z79899 Other long term (current) drug therapy: Secondary | ICD-10-CM | POA: Diagnosis not present

## 2015-09-04 NOTE — ED Notes (Signed)
Reports two knot on each side of groin after beginning a work out regime x one week ago. Denies urinary sx.

## 2015-09-05 ENCOUNTER — Emergency Department (HOSPITAL_BASED_OUTPATIENT_CLINIC_OR_DEPARTMENT_OTHER): Payer: Managed Care, Other (non HMO)

## 2015-09-05 ENCOUNTER — Encounter (HOSPITAL_BASED_OUTPATIENT_CLINIC_OR_DEPARTMENT_OTHER): Payer: Self-pay | Admitting: Emergency Medicine

## 2015-09-05 LAB — URINALYSIS, ROUTINE W REFLEX MICROSCOPIC
Bilirubin Urine: NEGATIVE
GLUCOSE, UA: NEGATIVE mg/dL
Ketones, ur: NEGATIVE mg/dL
Leukocytes, UA: NEGATIVE
Nitrite: NEGATIVE
Protein, ur: NEGATIVE mg/dL
SPECIFIC GRAVITY, URINE: 1.03 (ref 1.005–1.030)
Urobilinogen, UA: 4 mg/dL — ABNORMAL HIGH (ref 0.0–1.0)
pH: 6.5 (ref 5.0–8.0)

## 2015-09-05 LAB — BASIC METABOLIC PANEL
ANION GAP: 8 (ref 5–15)
BUN: 17 mg/dL (ref 6–20)
CALCIUM: 8.8 mg/dL — AB (ref 8.9–10.3)
CO2: 24 mmol/L (ref 22–32)
CREATININE: 1.15 mg/dL (ref 0.61–1.24)
Chloride: 103 mmol/L (ref 101–111)
GFR calc non Af Amer: >60 mL/min (ref >60)
Glucose, Bld: 95 mg/dL (ref 65–99)
Potassium: 3.6 mmol/L (ref 3.5–5.1)
SODIUM: 135 mmol/L (ref 135–145)

## 2015-09-05 LAB — CBC WITH DIFFERENTIAL/PLATELET
Basophils Absolute: 0 10*3/uL (ref 0.0–0.1)
Basophils Relative: 0 %
Eosinophils Absolute: 0.3 10*3/uL (ref 0.0–0.7)
Eosinophils Relative: 5 %
HCT: 39.6 % (ref 39.0–52.0)
HEMOGLOBIN: 13.2 g/dL (ref 13.0–17.0)
LYMPHS ABS: 2.5 10*3/uL (ref 0.7–4.0)
LYMPHS PCT: 39 %
MCH: 28.3 pg (ref 26.0–34.0)
MCHC: 33.3 g/dL (ref 30.0–36.0)
MCV: 85 fL (ref 78.0–100.0)
Monocytes Absolute: 0.8 10*3/uL (ref 0.1–1.0)
Monocytes Relative: 13 %
NEUTROS ABS: 2.7 10*3/uL (ref 1.7–7.7)
NEUTROS PCT: 43 %
Platelets: 255 10*3/uL (ref 150–400)
RBC: 4.66 MIL/uL (ref 4.22–5.81)
RDW: 12.2 % (ref 11.5–15.5)
WBC: 6.3 10*3/uL (ref 4.0–10.5)

## 2015-09-05 LAB — URINE MICROSCOPIC-ADD ON

## 2015-09-05 MED ORDER — CIPROFLOXACIN HCL 500 MG PO TABS
500.0000 mg | ORAL_TABLET | Freq: Once | ORAL | Status: AC
Start: 2015-09-05 — End: 2015-09-05
  Administered 2015-09-05: 500 mg via ORAL
  Filled 2015-09-05: qty 1

## 2015-09-05 MED ORDER — METRONIDAZOLE 500 MG PO TABS: 500.0000 mg | ORAL_TABLET | Freq: Three times a day (TID) | ORAL | Status: DC

## 2015-09-05 MED ORDER — METRONIDAZOLE 500 MG PO TABS
500.0000 mg | ORAL_TABLET | Freq: Once | ORAL | Status: AC
  Administered 2015-09-05: 500 mg via ORAL
  Filled 2015-09-05: qty 1

## 2015-09-05 MED ORDER — CIPROFLOXACIN HCL 500 MG PO TABS: 500.0000 mg | ORAL_TABLET | Freq: Two times a day (BID) | ORAL | Status: DC

## 2015-09-05 NOTE — ED Notes (Signed)
Knot on each side of upper groin area onset after exercising

## 2015-09-05 NOTE — ED Provider Notes (Signed)
CSN: [id number]     Arrival date & time 09/04/15  2305 History   First MD Initiated Contact with Patient 09/05/15 0035     Chief Complaint  Patient presents with  . Groin Pain     (Consider location/radiation/quality/duration/timing/severity/associated sxs/prior Treatment) Patient is a 34 y.o. male presenting with groin pain. The history is provided by the patient.  Groin Pain This is a new problem. The current episode started more than 1 week ago. The problem occurs constantly. The problem has not changed since onset.Pertinent negatives include no chest pain, no abdominal pain, no headaches and no shortness of breath. Nothing aggravates the symptoms. Nothing relieves the symptoms. He has tried nothing for the symptoms. The treatment provided no relief.  Thought originally he may have brought on the lumps in the groin with his new fitness regimen at [employer] states he was doing the hip abductor nautilus machine and noted the swelling shortly thereafter.  Several weeks ago he had night sweats but none this week.  No f/c/r.  No lesions on the skin.  No discharge no urinary symptoms no new partners.    Past Medical History  Diagnosis Date  . HIV disease   . Hypertension   . Asthma    History reviewed. No pertinent past surgical history. Family History  Problem Relation Age of Onset  . Breast cancer Mother    Social History  Substance Use Topics  . Smoking status: Current Some Day Smoker -- 0.10 packs/day    Types: Cigarettes    Last Attempt to Quit: 09/06/2011  . Smokeless tobacco: Never Used     Comment: no smoking for a couple of months  . Alcohol Use: 3.6 oz/week    6 Standard drinks or equivalent per week    Review of Systems  Constitutional: Negative for fever, chills, appetite change, fatigue and unexpected weight change.  Respiratory: Negative for shortness of breath.   Cardiovascular: Negative for chest pain.  Gastrointestinal: Negative for nausea, vomiting,  abdominal pain, diarrhea and constipation.  Genitourinary: Negative for dysuria, urgency, decreased urine volume, discharge, penile swelling, scrotal swelling, penile pain and testicular pain.  Neurological: Negative for headaches.  Hematological: Positive for adenopathy. Does not bruise/bleed easily.  All other systems reviewed and are negative.     Allergies  Sulfamethoxazole-trimethoprim  Home Medications   Prior to Admission medications   Medication Sig Start Date End Date Taking? Authorizing Provider  ciprofloxacin (CIPRO) 500 MG tablet Take 1 tablet (500 mg total) by mouth 2 (two) times daily. One po bid x 10 days 09/05/15   April Palumbo, MD  darunavir-cobicistat (PREZCOBIX) 800-150 MG per tablet Take 1 tablet by mouth daily. Swallow whole. Do NOT crush, break or chew tablets. Take with food. 08/07/15   Randall Hiss, MD  emtricitabine-tenofovir AF (DESCOVY) 200-25 MG per tablet Take 1 tablet by mouth daily. 08/07/15   Randall Hiss, MD  escitalopram (LEXAPRO) 10 MG tablet Take 1 tablet (10 mg total) by mouth daily. 07/29/15   Randall Hiss, MD  metroNIDAZOLE (FLAGYL) 500 MG tablet Take 1 tablet (500 mg total) by mouth 3 (three) times daily. 09/05/15   April Palumbo, MD  Spacer/Aero-Holding Chambers (BREATHERITE COLL SPACER ADULT) MISC 1 Container by Does not apply route 4 (four) times daily as needed. 09/13/12   Randall Hiss, MD   BP 138/77 mmHg  Pulse 92  Temp(Src) 98.8 F (37.1 C) (Oral)  Resp 16  Wt 230 lb (104.327  kg)  SpO2 100% Physical Exam  Constitutional: He is oriented to person, place, and time. He appears well-developed and well-nourished. No distress.  Pleasant, well appearing male resting comfortably in the room   HENT:  Head: Normocephalic and atraumatic.  Mouth/Throat: Oropharynx is clear and moist.  Eyes: Conjunctivae and EOM are normal. Pupils are equal, round, and reactive to light.  Neck: Normal range of motion. Neck supple.  No  cervical, no supraclavicular, epitrochelear, no axillary nor popliteal LAN    Cardiovascular: Normal rate, regular rhythm and intact distal pulses.   Pulmonary/Chest: Effort normal and breath sounds normal. No respiratory distress. He has no wheezes. He has no rales.  Abdominal: Soft. Bowel sounds are normal. He exhibits no distension and no mass. There is no tenderness. There is no rebound and no guarding.  Genitourinary:  Firm, groin LAN  3 cm LAN non tender nodes chaperon present  Musculoskeletal: Normal range of motion.  Lymphadenopathy:    He has no cervical adenopathy.  Neurological: He is alert and oriented to person, place, and time. He has normal reflexes.  Skin: Skin is warm and dry. No rash noted. No erythema. No pallor.  Psychiatric: He has a normal mood and affect.    ED Course  Procedures (including critical care time) Labs Review Labs Reviewed  URINALYSIS, ROUTINE W REFLEX MICROSCOPIC (NOT AT [facility]) - Abnormal; Notable for the following:    Hgb urine dipstick TRACE (*)    Urobilinogen, UA 4.0 (*)    All other components within normal limits  BASIC METABOLIC PANEL - Abnormal; Notable for the following:    Calcium 8.8 (*)    All other components within normal limits  URINE MICROSCOPIC-ADD ON  CBC WITH DIFFERENTIAL/PLATELET    Imaging Review Ct Renal Stone Study  09/05/2015   CLINICAL DATA:  34 year old male with abdominal pain and pain in the lower lymph nodes.  EXAM: CT ABDOMEN AND PELVIS WITHOUT CONTRAST  TECHNIQUE: Multidetector CT imaging of the abdomen and pelvis was performed following the standard protocol without IV contrast.  COMPARISON:  None.  FINDINGS: Evaluation of this exam is limited in the absence of intravenous contrast.  The visualized lung bases are clear. No intra-abdominal free air or free fluid.  The liver, gallbladder, pancreas, spleen, adrenal glands, kidneys, visualized ureters, and urinary bladder appear unremarkable. The prostate and seminal  vesicles are grossly unremarkable.  Moderate stool throughout the colon. There is apparent thickening of the wall of the descending colon, which although may partly be related to under distention, int concerning for enterocolitis given mild diffuse stranding of the mesentery and omentum. Clinical correlation is recommended. There is no evidence of bowel obstruction. Normal appendix.  The visualized abdominal aorta and IVC appear grossly unremarkable. No portal venous gas identified. There is no adenopathy.  Small fat containing umbilical hernia. There are bilateral inguinal adenopathy is with inflammatory changes and stranding of the stop nodes and adjacent subcutaneous fat. These findings are likely infectious in etiology. Clinical correlation and follow-up recommended. No drainable fluid collection/abscess identified.  IMPRESSION: Apparent diffuse thickening of the colon with stranding of the mesentery concerning for enterocolitis. Clinical correlation is recommended.  Enlarged and inflamed bilateral inguinal lymph nodes. Correlation with clinical exam and follow-up recommended.   Electronically Signed   By: Elgie Collard M.D.   On: 09/05/2015 02:21   I have personally reviewed and evaluated these images and lab results as part of my medical decision-making.   EKG Interpretation None  Results for orders placed or performed during the hospital encounter of 09/04/15  Urinalysis, Routine w reflex microscopic (not at [facility])  Result Value Ref Range   Color, Urine YELLOW YELLOW   APPearance CLEAR CLEAR   Specific Gravity, Urine 1.030 1.005 - 1.030   pH 6.5 5.0 - 8.0   Glucose, UA NEGATIVE NEGATIVE mg/dL   Hgb urine dipstick TRACE (A) NEGATIVE   Bilirubin Urine NEGATIVE NEGATIVE   Ketones, ur NEGATIVE NEGATIVE mg/dL   Protein, ur NEGATIVE NEGATIVE mg/dL   Urobilinogen, UA 4.0 (H) 0.0 - 1.0 mg/dL   Nitrite NEGATIVE NEGATIVE   Leukocytes, UA NEGATIVE NEGATIVE  Urine microscopic-add on  Result  Value Ref Range   Squamous Epithelial / LPF RARE RARE   WBC, UA 0-2 <3 WBC/hpf   RBC / HPF 0-2 <3 RBC/hpf  CBC with Differential/Platelet  Result Value Ref Range   WBC 6.3 4.0 - 10.5 K/uL   RBC 4.66 4.22 - 5.81 MIL/uL   Hemoglobin 13.2 13.0 - 17.0 g/dL   HCT 16.1 09.6 - 04.5 %   MCV 85.0 78.0 - 100.0 fL   MCH 28.3 26.0 - 34.0 pg   MCHC 33.3 30.0 - 36.0 g/dL   RDW 40.9 81.1 - 91.4 %   Platelets 255 150 - 400 K/uL   Neutrophils Relative % 43 %   Neutro Abs 2.7 1.7 - 7.7 K/uL   Lymphocytes Relative 39 %   Lymphs Abs 2.5 0.7 - 4.0 K/uL   Monocytes Relative 13 %   Monocytes Absolute 0.8 0.1 - 1.0 K/uL   Eosinophils Relative 5 %   Eosinophils Absolute 0.3 0.0 - 0.7 K/uL   Basophils Relative 0 %   Basophils Absolute 0.0 0.0 - 0.1 K/uL  Basic metabolic panel  Result Value Ref Range   Sodium 135 135 - 145 mmol/L   Potassium 3.6 3.5 - 5.1 mmol/L   Chloride 103 101 - 111 mmol/L   CO2 24 22 - 32 mmol/L   Glucose, Bld 95 65 - 99 mg/dL   BUN 17 6 - 20 mg/dL   Creatinine, Ser 7.82 0.61 - 1.24 mg/dL   Calcium 8.8 (L) 8.9 - 10.3 mg/dL   GFR calc non Af Amer >60 >60 mL/min   GFR calc Af Amer >60 >60 mL/min   Anion gap 8 5 - 15   Ct Renal Stone Study  09/05/2015   CLINICAL DATA:  34 year old male with abdominal pain and pain in the lower lymph nodes.  EXAM: CT ABDOMEN AND PELVIS WITHOUT CONTRAST  TECHNIQUE: Multidetector CT imaging of the abdomen and pelvis was performed following the standard protocol without IV contrast.  COMPARISON:  None.  FINDINGS: Evaluation of this exam is limited in the absence of intravenous contrast.  The visualized lung bases are clear. No intra-abdominal free air or free fluid.  The liver, gallbladder, pancreas, spleen, adrenal glands, kidneys, visualized ureters, and urinary bladder appear unremarkable. The prostate and seminal vesicles are grossly unremarkable.  Moderate stool throughout the colon. There is apparent thickening of the wall of the descending colon,  which although may partly be related to under distention, int concerning for enterocolitis given mild diffuse stranding of the mesentery and omentum. Clinical correlation is recommended. There is no evidence of bowel obstruction. Normal appendix.  The visualized abdominal aorta and IVC appear grossly unremarkable. No portal venous gas identified. There is no adenopathy.  Small fat containing umbilical hernia. There are bilateral inguinal adenopathy is with inflammatory changes and stranding of  the stop nodes and adjacent subcutaneous fat. These findings are likely infectious in etiology. Clinical correlation and follow-up recommended. No drainable fluid collection/abscess identified.  IMPRESSION: Apparent diffuse thickening of the colon with stranding of the mesentery concerning for enterocolitis. Clinical correlation is recommended.  Enlarged and inflamed bilateral inguinal lymph nodes. Correlation with clinical exam and follow-up recommended.   Electronically Signed   By: Elgie Collard M.D.   On: 09/05/2015 02:21     Medications  ciprofloxacin (CIPRO) tablet 500 mg (500 mg Oral Given 09/05/15 0320)  metroNIDAZOLE (FLAGYL) tablet 500 mg (500 mg Oral Given 09/05/15 0320)    MDM   Final diagnoses:  Pain  Colitis  Lymphadenopathy  No new partners no discharge no skin lesions.  States he had recent negative syphilis testing and states he "stays home alone"    LAN can be a side effect of patient's new HIV medication, it is unclear whether colitis is a side effect of this medication or whether these findings are unrelated.     Will treat for colitis for ten days with cipro and flagyl.  Start probiotics.  No sports while taking cipro.  Will refer to GI for follow up and back to Dr. Daiva Eves regarding LAN.  Strict return precautions given, patient verbalizes understanding and agrees to follow up    April Palumbo, MD 09/05/15 1610

## 2015-09-08 ENCOUNTER — Other Ambulatory Visit: Payer: Self-pay | Admitting: Infectious Disease

## 2015-09-08 ENCOUNTER — Ambulatory Visit (INDEPENDENT_AMBULATORY_CARE_PROVIDER_SITE_OTHER): Payer: Managed Care, Other (non HMO) | Admitting: Infectious Disease

## 2015-09-08 ENCOUNTER — Encounter: Payer: Self-pay | Admitting: Infectious Disease

## 2015-09-08 VITALS — BP 131/93 | HR 103 | Temp 98.1°F | Wt 222.0 lb

## 2015-09-08 DIAGNOSIS — Z23 Encounter for immunization: Secondary | ICD-10-CM | POA: Diagnosis not present

## 2015-09-08 DIAGNOSIS — B20 Human immunodeficiency virus [HIV] disease: Secondary | ICD-10-CM | POA: Diagnosis not present

## 2015-09-08 DIAGNOSIS — R599 Enlarged lymph nodes, unspecified: Secondary | ICD-10-CM

## 2015-09-08 DIAGNOSIS — R590 Localized enlarged lymph nodes: Secondary | ICD-10-CM

## 2015-09-08 DIAGNOSIS — A539 Syphilis, unspecified: Secondary | ICD-10-CM

## 2015-09-08 DIAGNOSIS — F331 Major depressive disorder, recurrent, moderate: Secondary | ICD-10-CM

## 2015-09-08 DIAGNOSIS — R197 Diarrhea, unspecified: Secondary | ICD-10-CM

## 2015-09-08 HISTORY — DX: Diarrhea, unspecified: R19.7

## 2015-09-08 HISTORY — DX: Localized enlarged lymph nodes: R59.0

## 2015-09-08 HISTORY — DX: Syphilis, unspecified: A53.9

## 2015-09-08 MED ORDER — TRAZODONE HCL 50 MG PO TABS: 50.0000 mg | ORAL_TABLET | Freq: Every day | ORAL | Status: AC

## 2015-09-08 NOTE — Progress Notes (Signed)
Chief complaint: BILATERAL INGUINAL LYMPH NODE ENLARGEMENT, DIARRHEA, AND INSOMNIA  Subjective:    Patient ID: Hayden Johns, male    DOB: 10/29/1981, 34 y.o.   MRN: [id number]  Insomnia Primary symptoms: sleep disturbance.     34 y.o. male [place] with 184V mutation present recently changed to PRezista, norvir and truvada and then PREZCOBIX  and TRUVADA and then PREZCOBIX and DESCOVY  He has very nice virological suppression and healthy CD4 count.  Lab Results  Component Value Date   HIV1RNAQUANT <20 05/26/2015   Lab Results  Component Value Date   CD4TABS 550 05/26/2015   CD4TABS 720 09/30/2014   CD4TABS 520 05/07/2014    I last saw him just a few weeks ago in August but since then he present to the emerge department with bilateral inguinal lymphadenopathy was that was painful to the touch. He also was having abdominal pain and diarrhea. He had a urinalysis checked but no analysis for GC and chlamydia nor any testing for syphilis at that visit. His CT scan done to look for kidney stones showed evidence of proctocolitis. He was you in prescription for ciprofloxacin and metronidazole and has improvement in his diarrhea and is a little lymphadenopathy. I have noticed that somehow he has not been tested for gonorrhea chlamydia for the past 3 years though I'm sure that the orders have been placed I suspect he is likely frequent said that he could not provide a urine sample. Today I sent swabs from his oropharynx as well as his rectum and a urine GC and chlamydia testing as well as a test for syphilis.  Past Medical History  Diagnosis Date  . HIV disease   . Hypertension   . Asthma   . Inguinal lymphadenopathy 09/08/2015  . Syphilis 09/08/2015  . Diarrhea 09/08/2015    No past surgical history on file.  Family History  Problem Relation Age of Onset  . Breast cancer Mother    Social History  Substance Use Topics  . Smoking status: Current Some Day Smoker -- 0.10  packs/day    Types: Cigarettes    Last Attempt to Quit: 09/06/2011  . Smokeless tobacco: Never Used     Comment: no smoking for a couple of months  . Alcohol Use: 3.6 oz/week    6 Standard drinks or equivalent per week   Allergies  Allergen Reactions  . Sulfamethoxazole-Trimethoprim     REACTION: whole body rash    Current outpatient prescriptions:  .  ciprofloxacin (CIPRO) 500 MG tablet, Take 1 tablet (500 mg total) by mouth 2 (two) times daily. One po bid x 10 days, Disp: 20 tablet, Rfl: 0 .  darunavir-cobicistat (PREZCOBIX) 800-150 MG per tablet, Take 1 tablet by mouth daily. Swallow whole. Do NOT crush, break or chew tablets. Take with food., Disp: 30 tablet, Rfl: 11 .  emtricitabine-tenofovir AF (DESCOVY) 200-25 MG per tablet, Take 1 tablet by mouth daily., Disp: 30 tablet, Rfl: 11 .  metroNIDAZOLE (FLAGYL) 500 MG tablet, Take 1 tablet (500 mg total) by mouth 3 (three) times daily., Disp: 30 tablet, Rfl: 0 .  escitalopram (LEXAPRO) 10 MG tablet, Take 1 tablet (10 mg total) by mouth daily. (Patient not taking: Reported on 09/08/2015), Disp: 30 tablet, Rfl: 11 .  traZODone (DESYREL) 50 MG tablet, Take 1 tablet (50 mg total) by mouth at bedtime., Disp: 30 tablet, Rfl: 11   Review of Systems  Constitutional: Negative for fever, chills, diaphoresis, appetite change, fatigue and unexpected weight change.  HENT:  Negative for congestion, rhinorrhea, sinus pressure, sneezing, sore throat and trouble swallowing.   Eyes: Negative for photophobia and visual disturbance.  Respiratory: Negative for cough, chest tightness, shortness of breath, wheezing and stridor.   Cardiovascular: Negative for chest pain, palpitations and leg swelling.  Gastrointestinal: Negative for nausea, vomiting, abdominal pain, diarrhea, constipation, blood in stool, abdominal distention and anal bleeding.  Genitourinary: Negative for dysuria, hematuria, flank pain and difficulty urinating.  Musculoskeletal: Negative for  myalgias, back pain, joint swelling, arthralgias and gait problem.  Skin: Negative for color change, pallor, rash and wound.  Neurological: Negative for dizziness, tremors, weakness and light-headedness.  Hematological: Positive for adenopathy. Does not bruise/bleed easily.  Psychiatric/Behavioral: Positive for sleep disturbance. Negative for behavioral problems, confusion, dysphoric mood and agitation. The patient has insomnia.        Objective:   Physical Exam  Constitutional: He is oriented to person, place, and time. He appears well-developed and well-nourished.  HENT:  Head: Normocephalic and atraumatic.  Eyes: Conjunctivae and EOM are normal.  Neck: Normal range of motion. Neck supple.  Cardiovascular: Normal rate and regular rhythm.   Pulmonary/Chest: Effort normal. No respiratory distress. He has no wheezes.  Abdominal: Soft. He exhibits no distension.  Musculoskeletal: Normal range of motion. He exhibits no edema or tenderness.  Lymphadenopathy:       Right: Inguinal adenopathy present.       Left: Inguinal adenopathy present.  Neurological: He is alert and oriented to person, place, and time.  Skin: Skin is warm and dry. No rash noted. No erythema. No pallor.  Psychiatric: He has a normal mood and affect. His behavior is normal. Judgment and thought content normal.          Assessment & Plan:   Bilateral renal lymphadenopathy that is tender and accompanied by diarrhea and evidence of proctocolitis on CT scan:  suspicious for a sexual transmitted disease such as gonorrhea chlamydia potentially syphilis. Some of these could be sensitive to ciprofloxacin and he is improving.  --check serum RPR, check urine rectal and oral pharyngeal gonorrhea chlamydia probe  HIV: Continue Prezcobix and DESCOVY   ASthma: Not using albuterol anymorestop smoking will taken off his profile  Depression: Continue lexapro, and can meet with Bernette Redbird  HTN: Blood pressure not too high today and  not on meds Filed Vitals:   09/08/15 0950  BP: 131/93  Pulse: 103  Temp: 98.1 F (36.7 C)   I spent greater than 25  minutes with the patient including greater than 50% of time in face to face counsel of the patient regarding his HIV, hypertension inguinal LA, depression and in coordination of their care.

## 2015-09-09 ENCOUNTER — Other Ambulatory Visit: Payer: Self-pay | Admitting: Infectious Disease

## 2015-09-09 LAB — URINE CYTOLOGY ANCILLARY ONLY
CHLAMYDIA, DNA PROBE: POSITIVE — AB
Neisseria Gonorrhea: NEGATIVE

## 2015-09-09 LAB — RPR

## 2015-09-10 ENCOUNTER — Telehealth: Payer: Self-pay | Admitting: *Deleted

## 2015-09-10 ENCOUNTER — Other Ambulatory Visit: Payer: Managed Care, Other (non HMO)

## 2015-09-10 ENCOUNTER — Ambulatory Visit (INDEPENDENT_AMBULATORY_CARE_PROVIDER_SITE_OTHER): Payer: Managed Care, Other (non HMO) | Admitting: *Deleted

## 2015-09-10 DIAGNOSIS — A64 Unspecified sexually transmitted disease: Secondary | ICD-10-CM | POA: Diagnosis not present

## 2015-09-10 DIAGNOSIS — A749 Chlamydial infection, unspecified: Secondary | ICD-10-CM

## 2015-09-10 LAB — GC/CHLAMYDIA PROBE AMP
CT PROBE, AMP APTIMA: NEGATIVE
CT PROBE, AMP APTIMA: NEGATIVE
GC PROBE AMP APTIMA: NEGATIVE
GC Probe RNA: NEGATIVE

## 2015-09-10 MED ORDER — AZITHROMYCIN 250 MG PO TABS: 1000.0000 mg | ORAL_TABLET | Freq: Every day | ORAL | Status: DC

## 2015-09-10 MED ORDER — AZITHROMYCIN 250 MG PO TABS
1000.0000 mg | ORAL_TABLET | Freq: Once | ORAL | Status: AC
  Administered 2015-09-10: 1000 mg via ORAL

## 2015-09-10 MED ORDER — CEFTRIAXONE SODIUM 1 G IJ SOLR
250.0000 mg | Freq: Once | INTRAMUSCULAR | Status: AC
  Administered 2015-09-10: 250 mg via INTRAMUSCULAR

## 2015-09-10 NOTE — Telephone Encounter (Signed)
Per Dr. Daiva Eves patient is positive for chlamydia and needs treatment with one gram of azithromycin. Patient was swabbed by Dr. Daiva Eves rectally and orally to check for gonorrhea and after calling cytology at [facility]. It appears the test never reached their department. Patient was given the option per Dr. Daiva Eves to be treated for both and he agreed. Dr. Daiva Eves wants him treated with azithromycin and Rocephin 250 mg IM injection. Patient added to nurse schedule and will come in today.  Wendall Mola

## 2015-10-14 ENCOUNTER — Other Ambulatory Visit: Payer: Self-pay | Admitting: *Deleted

## 2015-10-14 DIAGNOSIS — Z21 Asymptomatic human immunodeficiency virus [HIV] infection status: Secondary | ICD-10-CM

## 2015-10-14 MED ORDER — DARUNAVIR-COBICISTAT 800-150 MG PO TABS: 1.0000 | ORAL_TABLET | Freq: Every day | ORAL | Status: DC

## 2015-10-14 MED ORDER — EMTRICITABINE-TENOFOVIR AF 200-25 MG PO TABS: 1.0000 | ORAL_TABLET | Freq: Every day | ORAL | Status: DC

## 2015-12-10 ENCOUNTER — Emergency Department (HOSPITAL_BASED_OUTPATIENT_CLINIC_OR_DEPARTMENT_OTHER)
Admission: EM | Admit: 2015-12-10 | Discharge: 2015-12-10 | Disposition: A | Discharge status: Home / Self Care | Payer: Managed Care, Other (non HMO) | Attending: Emergency Medicine | Admitting: Emergency Medicine

## 2015-12-10 ENCOUNTER — Encounter (HOSPITAL_BASED_OUTPATIENT_CLINIC_OR_DEPARTMENT_OTHER): Payer: Self-pay

## 2015-12-10 DIAGNOSIS — M79671 Pain in right foot: Secondary | ICD-10-CM | POA: Diagnosis present

## 2015-12-10 DIAGNOSIS — I10 Essential (primary) hypertension: Secondary | ICD-10-CM | POA: Insufficient documentation

## 2015-12-10 DIAGNOSIS — F17210 Nicotine dependence, cigarettes, uncomplicated: Secondary | ICD-10-CM | POA: Diagnosis not present

## 2015-12-10 DIAGNOSIS — Z79899 Other long term (current) drug therapy: Secondary | ICD-10-CM | POA: Diagnosis not present

## 2015-12-10 DIAGNOSIS — Z21 Asymptomatic human immunodeficiency virus [HIV] infection status: Secondary | ICD-10-CM | POA: Insufficient documentation

## 2015-12-10 DIAGNOSIS — D849 Immunodeficiency, unspecified: Secondary | ICD-10-CM | POA: Diagnosis not present

## 2015-12-10 DIAGNOSIS — J45909 Unspecified asthma, uncomplicated: Secondary | ICD-10-CM | POA: Diagnosis not present

## 2015-12-10 DIAGNOSIS — L03115 Cellulitis of right lower limb: Secondary | ICD-10-CM | POA: Diagnosis not present

## 2015-12-10 DIAGNOSIS — Z8619 Personal history of other infectious and parasitic diseases: Secondary | ICD-10-CM | POA: Insufficient documentation

## 2015-12-10 MED ORDER — CLINDAMYCIN HCL 150 MG PO CAPS: 300.0000 mg | ORAL_CAPSULE | Freq: Three times a day (TID) | ORAL | Status: AC

## 2015-12-10 MED ORDER — CLINDAMYCIN HCL 150 MG PO CAPS
300.0000 mg | ORAL_CAPSULE | Freq: Once | ORAL | Status: AC
  Administered 2015-12-10: 300 mg via ORAL
  Filled 2015-12-10: qty 2

## 2015-12-10 NOTE — ED Notes (Signed)
Place guaze between toes, then wrapped with kerlix.  Pt placed in post op shoe by Onalee Huaavid EMT

## 2015-12-10 NOTE — ED Provider Notes (Signed)
CSN: [id number]     Arrival date & time 12/10/15  1414 History   First MD Initiated Contact with Patient 12/10/15 1429     Chief Complaint  Patient presents with  . Foot Pain     (Consider location/radiation/quality/duration/timing/severity/associated sxs/prior Treatment) HPI Patient presents with concern of a right foot lesion. Swelling began about 2 days ago, around an area of blistering between the fourth and fifth digits. Subsequent, the blister has opened, and the patient felt increasing erythema, pain around the distal lateral foot. No concurrent fever, chills, nausea, vomiting, other focal complaints. Patient has HIV, states that his disease is well controlled. Last CD4 count greater than 500, last viral load undetectable. Since onset, no clear alleviating or exacerbating factors, including minimal relief with topical antibiotics. Past Medical History  Diagnosis Date  . HIV disease (HCC)   . Hypertension   . Asthma   . Inguinal lymphadenopathy 09/08/2015  . Syphilis 09/08/2015  . Diarrhea 09/08/2015   History reviewed. No pertinent past surgical history. Family History  Problem Relation Age of Onset  . Breast cancer Mother    Social History  Substance Use Topics  . Smoking status: Current Some Day Smoker -- 0.10 packs/day    Types: Cigarettes  . Smokeless tobacco: Never Used     Comment: no smoking for a couple of months  . Alcohol Use: 3.6 oz/week    6 Standard drinks or equivalent per week     Comment: occ    Review of Systems  Constitutional:       Per HPI, otherwise negative  HENT:       Per HPI, otherwise negative  Respiratory:       Per HPI, otherwise negative  Cardiovascular:       Per HPI, otherwise negative  Gastrointestinal: Negative for nausea and vomiting.  Endocrine:       Negative aside from HPI  Genitourinary:       Neg aside from HPI   Musculoskeletal:       Per HPI, otherwise negative  Skin: Positive for wound.  Allergic/Immunologic:  Positive for immunocompromised state.  Neurological: Negative for syncope and weakness.      Allergies  Sulfamethoxazole-trimethoprim  Home Medications   Prior to Admission medications   Medication Sig Start Date End Date Taking? Authorizing Provider  darunavir-cobicistat (PREZCOBIX) 800-150 MG tablet Take 1 tablet by mouth daily. Swallow whole. Do NOT crush, break or chew tablets. Take with food. 10/14/15   Randall Hiss, MD  emtricitabine-tenofovir AF (DESCOVY) 200-25 MG tablet Take 1 tablet by mouth daily. 10/14/15   Randall Hiss, MD  escitalopram (LEXAPRO) 10 MG tablet Take 1 tablet (10 mg total) by mouth daily. Patient not taking: Reported on 09/08/2015 07/29/15   Randall Hiss, MD  traZODone (DESYREL) 50 MG tablet Take 1 tablet (50 mg total) by mouth at bedtime. 09/08/15   Randall Hiss, MD   BP 136/94 mmHg  Pulse 95  Temp(Src) 98.6 F (37 C) (Oral)  Resp 18  Ht  (1.981 m)  Wt 220 lb (99.791 kg)  BMI 25.43 kg/m2  SpO2 100% Physical Exam  Constitutional: He is oriented to person, place, and time. He appears well-developed. No distress.  HENT:  Head: Normocephalic and atraumatic.  Eyes: Conjunctivae and EOM are normal.  Cardiovascular: Normal rate, regular rhythm and intact distal pulses.   Pulmonary/Chest: Effort normal. No stridor. No respiratory distress.  Abdominal: He exhibits no distension.  Musculoskeletal:  He exhibits no edema.  Neurological: He is alert and oriented to person, place, and time.  Skin: There is erythema.     Psychiatric: He has a normal mood and affect.  Nursing note and vitals reviewed.   ED Course  Procedures (including critical care time) Labs Review Labs Reviewed - No data to display  Imaging Review No results found. I have personally reviewed and evaluated these images and lab results as part of my medical decision-making.   EKG Interpretation None        MDM  Young male with HIV presents with new  right foot lesion consistent with cellulitis. No evidence for bacteremia or sepsis. The wound required soaking in chlorhexidine, dry dressing, was wrapped, and the patient was placed in a postoperative boot to minimize friction. He will follow-up with his primary care team.  Gerhard Munchobert Kearra Calkin, MD 12/10/15 [id number]

## 2015-12-10 NOTE — ED Notes (Signed)
C/o pain to right foot x 2 days-reports ? Insect bite-steady gait

## 2015-12-10 NOTE — Discharge Instructions (Signed)

## 2015-12-25 ENCOUNTER — Emergency Department (HOSPITAL_BASED_OUTPATIENT_CLINIC_OR_DEPARTMENT_OTHER)
Admission: EM | Admit: 2015-12-25 | Discharge: 2015-12-26 | Disposition: A | Discharge status: Home / Self Care | Payer: Managed Care, Other (non HMO) | Attending: Emergency Medicine | Admitting: Emergency Medicine

## 2015-12-25 ENCOUNTER — Encounter (HOSPITAL_BASED_OUTPATIENT_CLINIC_OR_DEPARTMENT_OTHER): Payer: Self-pay

## 2015-12-25 ENCOUNTER — Telehealth: Payer: Self-pay | Admitting: *Deleted

## 2015-12-25 DIAGNOSIS — R2241 Localized swelling, mass and lump, right lower limb: Secondary | ICD-10-CM | POA: Diagnosis present

## 2015-12-25 DIAGNOSIS — B353 Tinea pedis: Secondary | ICD-10-CM | POA: Diagnosis not present

## 2015-12-25 DIAGNOSIS — I10 Essential (primary) hypertension: Secondary | ICD-10-CM | POA: Diagnosis not present

## 2015-12-25 DIAGNOSIS — F17210 Nicotine dependence, cigarettes, uncomplicated: Secondary | ICD-10-CM | POA: Insufficient documentation

## 2015-12-25 DIAGNOSIS — Z8619 Personal history of other infectious and parasitic diseases: Secondary | ICD-10-CM | POA: Diagnosis not present

## 2015-12-25 DIAGNOSIS — Z21 Asymptomatic human immunodeficiency virus [HIV] infection status: Secondary | ICD-10-CM | POA: Diagnosis not present

## 2015-12-25 DIAGNOSIS — J45909 Unspecified asthma, uncomplicated: Secondary | ICD-10-CM | POA: Diagnosis not present

## 2015-12-25 DIAGNOSIS — L03031 Cellulitis of right toe: Secondary | ICD-10-CM | POA: Diagnosis not present

## 2015-12-25 NOTE — Telephone Encounter (Signed)
Left small toe "itching" similar to when he was seen in ED recently.  Requesting an appt.  Appt scheduled.

## 2015-12-25 NOTE — ED Notes (Signed)
Pt reports he was seen 12/10/15 for blister to right foot-c/o increase in swelling to area x 2 days

## 2015-12-26 MED ORDER — CLINDAMYCIN HCL 150 MG PO CAPS
600.0000 mg | ORAL_CAPSULE | Freq: Once | ORAL | Status: AC
  Administered 2015-12-26: 600 mg via ORAL
  Filled 2015-12-26: qty 4

## 2015-12-26 MED ORDER — TERBINAFINE HCL 1 % EX CREA: 1.0000 "application " | TOPICAL_CREAM | Freq: Two times a day (BID) | CUTANEOUS | Status: AC

## 2015-12-26 MED ORDER — CLINDAMYCIN HCL 150 MG PO CAPS: 300.0000 mg | ORAL_CAPSULE | Freq: Three times a day (TID) | ORAL | Status: DC

## 2015-12-26 NOTE — ED Provider Notes (Addendum)
CSN: [id number]     Arrival date & time 12/25/15  2249 History   First MD Initiated Contact with Patient 12/26/15 0043     Chief Complaint  Patient presents with  . Foot Swelling     (Consider location/radiation/quality/duration/timing/severity/associated sxs/prior Treatment) HPI  This is a 35 year old male with HIV disease. He was treated on December 21 of last year for cellulitis of the right fifth toe. It was believed to be seated by an adjacent "blister" in the web between the right fourth and fifth toes. His symptoms had improved but recurred today. He is having pain and swelling of the right fifth toe, worse with palpation or movement. He denies fever. He has an appointment with the department of infectious diseases in 3 days.  Past Medical History  Diagnosis Date  . HIV disease (HCC)   . Hypertension   . Asthma   . Inguinal lymphadenopathy 09/08/2015  . Syphilis 09/08/2015  . Diarrhea 09/08/2015   History reviewed. No pertinent past surgical history. Family History  Problem Relation Age of Onset  . Breast cancer Mother    Social History  Substance Use Topics  . Smoking status: Current Some Day Smoker -- 0.10 packs/day    Types: Cigarettes  . Smokeless tobacco: Never Used     Comment: no smoking for a couple of months  . Alcohol Use: 3.6 oz/week    6 Standard drinks or equivalent per week     Comment: occ    Review of Systems  All other systems reviewed and are negative.   Allergies  Sulfamethoxazole-trimethoprim  Home Medications   Prior to Admission medications   Medication Sig Start Date End Date Taking? Authorizing Provider  darunavir-cobicistat (PREZCOBIX) 800-150 MG tablet Take 1 tablet by mouth daily. Swallow whole. Do NOT crush, break or chew tablets. Take with food. 10/14/15   Randall Hissornelius N Van Dam, MD  emtricitabine-tenofovir AF (DESCOVY) 200-25 MG tablet Take 1 tablet by mouth daily. 10/14/15   Randall Hissornelius N Van Dam, MD  escitalopram (LEXAPRO) 10 MG tablet  Take 1 tablet (10 mg total) by mouth daily. Patient not taking: Reported on 09/08/2015 07/29/15   Randall Hissornelius N Van Dam, MD  traZODone (DESYREL) 50 MG tablet Take 1 tablet (50 mg total) by mouth at bedtime. 09/08/15   Randall Hissornelius N Van Dam, MD   BP 145/98 mmHg  Pulse 94  Temp(Src) 98.3 F (36.8 C)  Resp 16  Ht 6\' 6"  (1.981 m)  Wt 225 lb (102.059 kg)  BMI 26.01 kg/m2  SpO2 98%   Physical Exam  General: Well-developed, well-nourished male in no acute distress; appearance consistent with age of record HENT: normocephalic; atraumatic Eyes: pupils equal, round and reactive to light; extraocular muscles intact Neck: supple Heart: regular rate and rhythm Lungs: clear to auscultation bilaterally Abdomen: soft; nondistended Extremities: No deformity; full range of motion except right fifth toe due to pain and swelling; pulses normal; tenderness, mild erythema and swelling of right fifth toe, loss of epidermis in the web between the right fourth and fifth toes:   Neurologic: Awake, alert and oriented; motor function intact in all extremities and symmetric; no facial droop Skin: Warm and dry Psychiatric: Normal mood and affect    ED Course  Procedures (including critical care time)   MDM  12:52 AM Patient had good relief with clindamycin prescribed previously. We will restart him. The toe is nonfluctuant and does not appear to have a paronychia or felon requiring I&D now. The patient was advised that  a drainable infection may develop and if symptoms should worsen over the next 12-24 hours he should return.    Paula Libra, MD 12/26/15 7829  Paula Libra, MD 12/26/15 [phone]

## 2015-12-26 NOTE — Discharge Instructions (Signed)

## 2015-12-29 ENCOUNTER — Ambulatory Visit: Payer: Managed Care, Other (non HMO) | Admitting: Infectious Disease

## 2015-12-30 ENCOUNTER — Ambulatory Visit: Payer: Managed Care, Other (non HMO) | Admitting: Infectious Disease

## 2016-01-27 ENCOUNTER — Other Ambulatory Visit (INDEPENDENT_AMBULATORY_CARE_PROVIDER_SITE_OTHER): Payer: Managed Care, Other (non HMO)

## 2016-01-27 DIAGNOSIS — B20 Human immunodeficiency virus [HIV] disease: Secondary | ICD-10-CM

## 2016-01-27 LAB — COMPLETE METABOLIC PANEL WITH GFR
ALBUMIN: 4.2 g/dL (ref 3.6–5.1)
ALK PHOS: 77 U/L (ref 40–115)
ALT: 30 U/L (ref 9–46)
AST: 31 U/L (ref 10–40)
BILIRUBIN TOTAL: 0.3 mg/dL (ref 0.2–1.2)
BUN: 13 mg/dL (ref 7–25)
CALCIUM: 9 mg/dL (ref 8.6–10.3)
CO2: 26 mmol/L (ref 20–31)
CREATININE: 1.07 mg/dL (ref 0.60–1.35)
Chloride: 104 mmol/L (ref 98–110)
GFR, Est African American: >89 mL/min (ref >=60)
GFR, Est Non African American: 89 mL/min (ref >=60)
Glucose, Bld: 96 mg/dL (ref 65–99)
POTASSIUM: 4 mmol/L (ref 3.5–5.3)
Sodium: 139 mmol/L (ref 135–146)
TOTAL PROTEIN: 7.3 g/dL (ref 6.1–8.1)

## 2016-01-27 LAB — LIPID PANEL
CHOL/HDL RATIO: 3 ratio (ref <=5.0)
CHOLESTEROL: 122 mg/dL — AB (ref 125–200)
HDL: 41 mg/dL (ref >=40)
LDL Cholesterol: 38 mg/dL (ref <130)
Triglycerides: 213 mg/dL — ABNORMAL HIGH (ref <150)
VLDL: 43 mg/dL — ABNORMAL HIGH (ref <30)

## 2016-01-27 LAB — CBC WITH DIFFERENTIAL/PLATELET
Basophils Absolute: 0.1 10*3/uL (ref 0.0–0.1)
Basophils Relative: 1 % (ref 0–1)
Eosinophils Absolute: 0.4 10*3/uL (ref 0.0–0.7)
Eosinophils Relative: 8 % — ABNORMAL HIGH (ref 0–5)
HEMATOCRIT: 42.1 % (ref 39.0–52.0)
HEMOGLOBIN: 14.6 g/dL (ref 13.0–17.0)
LYMPHS PCT: 61 % — AB (ref 12–46)
Lymphs Abs: 3.2 10*3/uL (ref 0.7–4.0)
MCH: 29 pg (ref 26.0–34.0)
MCHC: 34.7 g/dL (ref 30.0–36.0)
MCV: 83.5 fL (ref 78.0–100.0)
MONO ABS: 0.4 10*3/uL (ref 0.1–1.0)
MONOS PCT: 7 % (ref 3–12)
MPV: 9.3 fL (ref 8.6–12.4)
NEUTROS ABS: 1.2 10*3/uL — AB (ref 1.7–7.7)
Neutrophils Relative %: 23 % — ABNORMAL LOW (ref 43–77)
Platelets: 254 10*3/uL (ref 150–400)
RBC: 5.04 MIL/uL (ref 4.22–5.81)
RDW: 13.5 % (ref 11.5–15.5)
WBC: 5.2 10*3/uL (ref 4.0–10.5)

## 2016-01-28 LAB — HIV-1 RNA QUANT-NO REFLEX-BLD
HIV 1 RNA Quant: <20 copies/mL (ref <20)
HIV-1 RNA Quant, Log: <1.3 Log copies/mL (ref <1.30)

## 2016-01-28 LAB — T-HELPER CELL (CD4) - (RCID CLINIC ONLY)
CD4 % Helper T Cell: 26 % — ABNORMAL LOW (ref 33–55)
CD4 T Cell Abs: 800 /uL (ref 400–2700)

## 2016-01-28 LAB — MICROALBUMIN / CREATININE URINE RATIO
CREATININE, URINE: 362 mg/dL (ref 20–370)
MICROALB UR: 1.1 mg/dL
MICROALB/CREAT RATIO: 3 ug/mg{creat} (ref <30)

## 2016-01-28 LAB — URINE CYTOLOGY ANCILLARY ONLY
Chlamydia: NEGATIVE
NEISSERIA GONORRHEA: NEGATIVE

## 2016-01-28 LAB — RPR

## 2016-02-03 LAB — HLA B*5701: HLA-B 5701 W/RFLX HLA-B HIGH: NEGATIVE

## 2016-02-18 ENCOUNTER — Encounter: Payer: Self-pay | Admitting: Infectious Disease

## 2016-02-18 ENCOUNTER — Ambulatory Visit (INDEPENDENT_AMBULATORY_CARE_PROVIDER_SITE_OTHER): Payer: Managed Care, Other (non HMO) | Admitting: Infectious Disease

## 2016-02-18 VITALS — BP 148/100 | HR 91 | Temp 97.7°F | Wt 226.0 lb

## 2016-02-18 DIAGNOSIS — A539 Syphilis, unspecified: Secondary | ICD-10-CM

## 2016-02-18 DIAGNOSIS — Z720 Tobacco use: Secondary | ICD-10-CM

## 2016-02-18 DIAGNOSIS — B20 Human immunodeficiency virus [HIV] disease: Secondary | ICD-10-CM | POA: Diagnosis not present

## 2016-02-18 DIAGNOSIS — F325 Major depressive disorder, single episode, in full remission: Secondary | ICD-10-CM | POA: Diagnosis not present

## 2016-02-18 DIAGNOSIS — A64 Unspecified sexually transmitted disease: Secondary | ICD-10-CM

## 2016-02-18 DIAGNOSIS — F17200 Nicotine dependence, unspecified, uncomplicated: Secondary | ICD-10-CM

## 2016-02-18 DIAGNOSIS — Z202 Contact with and (suspected) exposure to infections with a predominantly sexual mode of transmission: Secondary | ICD-10-CM | POA: Insufficient documentation

## 2016-02-18 DIAGNOSIS — A749 Chlamydial infection, unspecified: Secondary | ICD-10-CM

## 2016-02-18 HISTORY — DX: Chlamydial infection, unspecified: A74.9

## 2016-02-18 HISTORY — DX: Contact with and (suspected) exposure to infections with a predominantly sexual mode of transmission: Z20.2

## 2016-02-18 MED ORDER — CEFTRIAXONE SODIUM 1 G IJ SOLR
250.0000 mg | Freq: Once | INTRAMUSCULAR | Status: AC
  Administered 2016-02-18: 250 mg via INTRAMUSCULAR

## 2016-02-18 MED ORDER — AZITHROMYCIN 250 MG PO TABS
1000.0000 mg | ORAL_TABLET | Freq: Once | ORAL | Status: AC
  Administered 2016-02-18: 1000 mg via ORAL

## 2016-02-18 NOTE — Progress Notes (Signed)
Chief complaint: insomnia due to anxiety about exposure to gonorrhea  Subjective:    Patient IDSutter Johns, male    DOB: [birth year], 35 y.o.   MRN: [id number]  Insomnia Primary symptoms: sleep disturbance.     35 y.o. male [place] with 184V mutation present recently changed to PRezista, norvir and truvada and then PREZCOBIX  and TRUVADA and then PREZCOBIX and DESCOVY  He has very nice virological suppression and healthy CD4 count.  Lab Results  Component Value Date   HIV1RNAQUANT <20 01/27/2016   Lab Results  Component Value Date   CD4TABS 800 01/27/2016   CD4TABS 550 05/26/2015   CD4TABS 720 09/30/2014    He is very anxious today having been called by [facility] re exposure to gonorrhea recently He states that he had only had unprotected sex when giving oral sex to the subject who was diagnosed with GC.  Past Medical History  Diagnosis Date  . HIV disease (HCC)   . Hypertension   . Asthma   . Inguinal lymphadenopathy 09/08/2015  . Syphilis 09/08/2015  . Diarrhea 09/08/2015    No past surgical history on file.  Family History  Problem Relation Age of Onset  . Breast cancer Mother    Social History  Substance Use Topics  . Smoking status: Current Some Day Smoker -- 0.10 packs/day    Types: Cigarettes  . Smokeless tobacco: Never Used     Comment: no smoking for a couple of months  . Alcohol Use: 3.6 oz/week    6 Standard drinks or equivalent per week     Comment: occ   Allergies  Allergen Reactions  . Sulfamethoxazole-Trimethoprim     REACTION: whole body rash    Current outpatient prescriptions:  .  clindamycin (CLEOCIN) 150 MG capsule, Take 2 capsules (300 mg total) by mouth 3 (three) times daily., Disp: 42 capsule, Rfl: 0 .  darunavir-cobicistat (PREZCOBIX) 800-150 MG tablet, Take 1 tablet by mouth daily. Swallow whole. Do NOT crush, break or chew tablets. Take with food., Disp: 30 tablet, Rfl: 6 .  emtricitabine-tenofovir AF (DESCOVY) 200-25  MG tablet, Take 1 tablet by mouth daily., Disp: 30 tablet, Rfl: 6 .  terbinafine (LAMISIL AT) 1 % cream, Apply 1 application topically 2 (two) times daily. Apply to affected toes twice daily., Disp: , Rfl:  .  traZODone (DESYREL) 50 MG tablet, Take 1 tablet (50 mg total) by mouth at bedtime., Disp: 30 tablet, Rfl: 11 .  [DISCONTINUED] escitalopram (LEXAPRO) 10 MG tablet, Take 1 tablet (10 mg total) by mouth daily. (Patient not taking: Reported on 09/08/2015), Disp: 30 tablet, Rfl: 11   Review of Systems  Constitutional: Negative for fever, chills, diaphoresis, appetite change, fatigue and unexpected weight change.  HENT: Negative for congestion, rhinorrhea, sinus pressure, sneezing, sore throat and trouble swallowing.   Eyes: Negative for photophobia and visual disturbance.  Respiratory: Negative for cough, chest tightness, shortness of breath, wheezing and stridor.   Cardiovascular: Negative for chest pain, palpitations and leg swelling.  Gastrointestinal: Negative for nausea, vomiting, abdominal pain, diarrhea, constipation, blood in stool, abdominal distention and anal bleeding.  Genitourinary: Negative for dysuria, hematuria, flank pain and difficulty urinating.  Musculoskeletal: Negative for myalgias, back pain, joint swelling, arthralgias and gait problem.  Skin: Negative for color change, pallor, rash and wound.  Neurological: Negative for dizziness, tremors, weakness and light-headedness.  Hematological: Does not bruise/bleed easily.  Psychiatric/Behavioral: Positive for sleep disturbance. Negative for behavioral problems, confusion, dysphoric mood and agitation. The patient is  nervous/anxious and has insomnia.        Objective:   Physical Exam  Constitutional: He is oriented to person, place, and time. He appears well-developed and well-nourished.  HENT:  Head: Normocephalic and atraumatic.  Eyes: Conjunctivae and EOM are normal.  Neck: Normal range of motion. Neck supple.    Cardiovascular: Normal rate and regular rhythm.   Pulmonary/Chest: Effort normal. No respiratory distress. He has no wheezes.  Abdominal: Soft. He exhibits no distension.  Musculoskeletal: Normal range of motion. He exhibits no edema or tenderness.  Neurological: He is alert and oriented to person, place, and time.  Skin: Skin is warm and dry. No rash noted. No erythema. No pallor.  Psychiatric: His behavior is normal. Judgment and thought content normal. His mood appears anxious.  Nursing note and vitals reviewed.         Assessment & Plan:   Exposure to GC: check pt for GC, chlamydia and syphilis again. Will empirically treat for GC and chlamydia today with rocephin and 1 gram azithromycin  HIV: Continue Prezcobix and DESCOVY   Depression: on trazadone  HTN:  not on meds but will need them   I spent greater than 25  minutes with the patient including greater than 50% of time in face to face counsel of the patient regarding his HIV, hypertension exposure to [facility], STD prevention  and in coordination of his care.

## 2016-02-19 LAB — RPR

## 2016-02-19 LAB — URINE CYTOLOGY ANCILLARY ONLY
CHLAMYDIA, DNA PROBE: NEGATIVE
Neisseria Gonorrhea: NEGATIVE

## 2016-02-19 LAB — CYTOLOGY, (ORAL, ANAL, URETHRAL) ANCILLARY ONLY
CHLAMYDIA, DNA PROBE: NEGATIVE
Chlamydia: NEGATIVE
NEISSERIA GONORRHEA: NEGATIVE
NEISSERIA GONORRHEA: NEGATIVE

## 2016-03-07 IMAGING — CT CT RENAL STONE PROTOCOL
2 of 4 series · 15 of 46 positions shown, 17 images · non-contrast
Comparison: None.

CLINICAL DATA: 34-year-old male with abdominal pain and pain in the
lower lymph nodes.

EXAM:
CT ABDOMEN AND PELVIS WITHOUT CONTRAST
TECHNIQUE: Multidetector CT imaging of the abdomen and pelvis was performed
following the standard protocol without IV contrast.

[Series 2: renal stone < 200 lbs 5.0 b31f · axial · 0.81mm/px · z∈[-534,-39]mm · 12 of 113 slices shown, 14 images]
[im 9/113  soft-tissue]
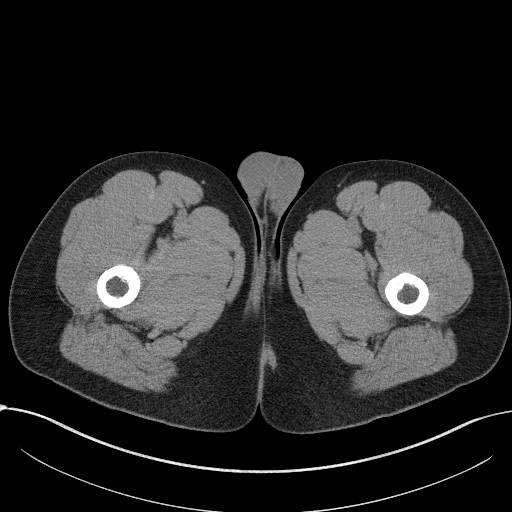
[im 9/113  bone]
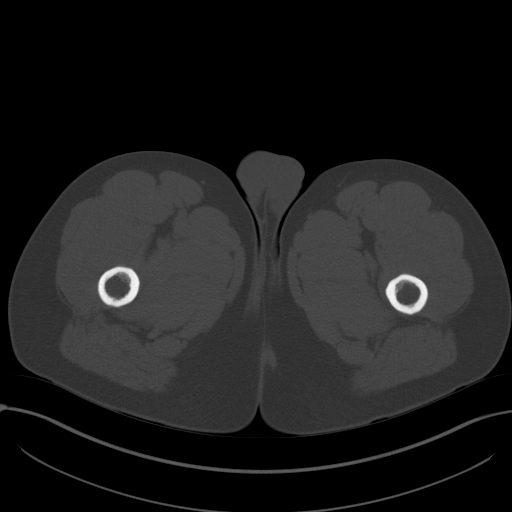
[im 18/113  soft-tissue]
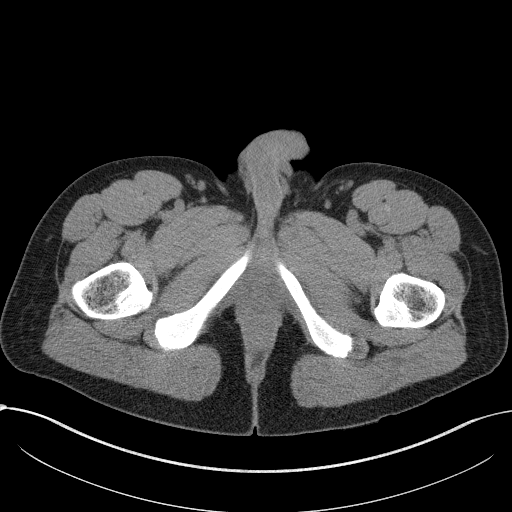
[im 27/113  soft-tissue]
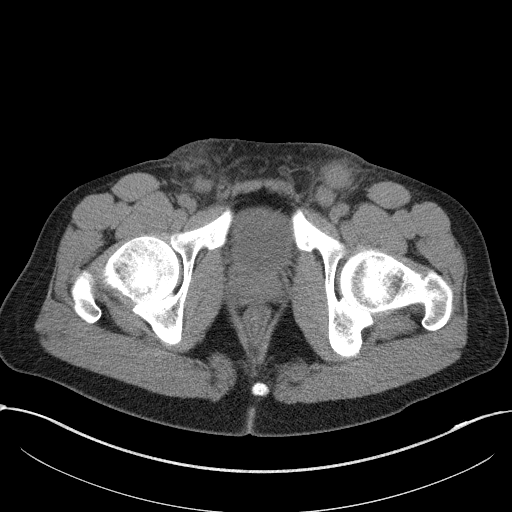
[im 36/113  soft-tissue]
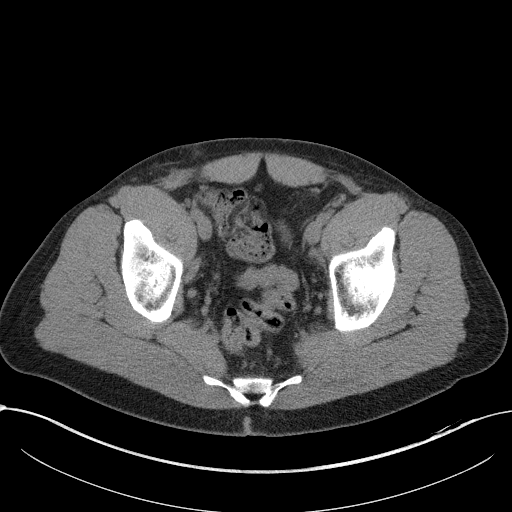
[im 45/113  soft-tissue]
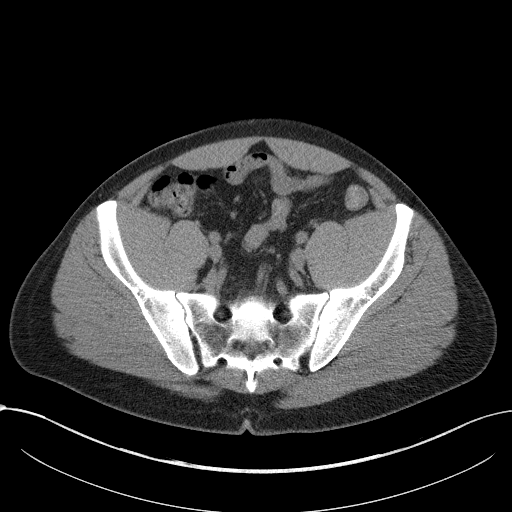
[im 54/113  soft-tissue]
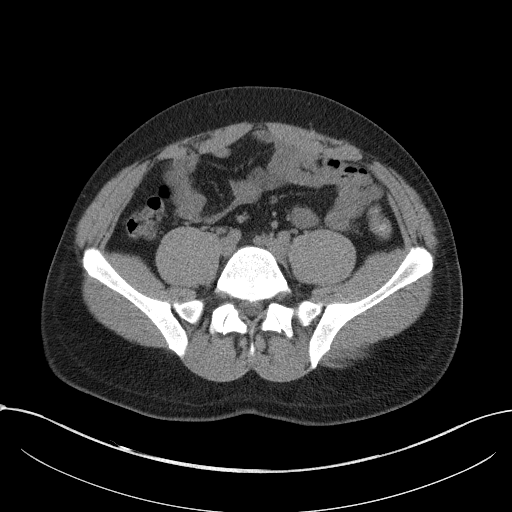
[im 63/113  soft-tissue]
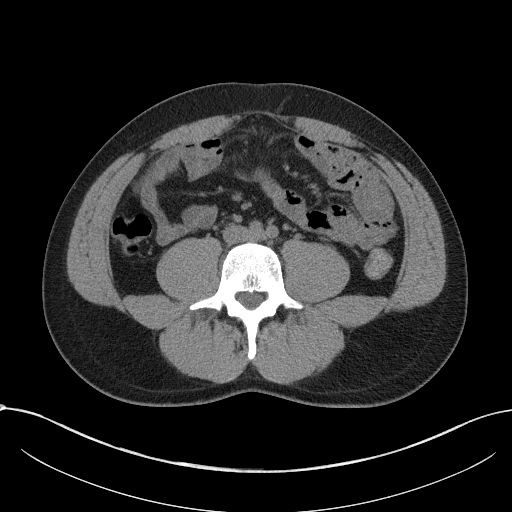
[im 72/113  soft-tissue]
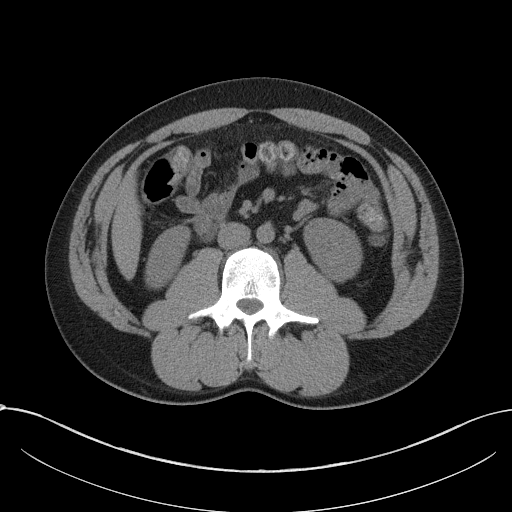
[im 81/113  soft-tissue]
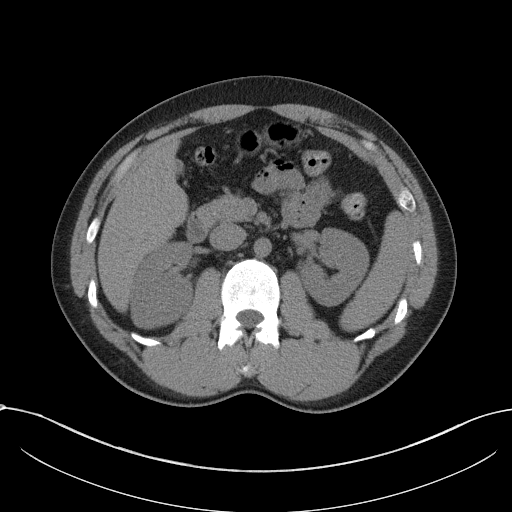
[im 81/113  bone]
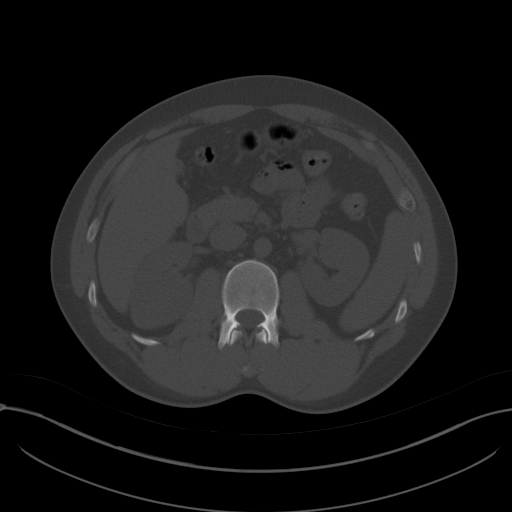
[im 90/113  soft-tissue]
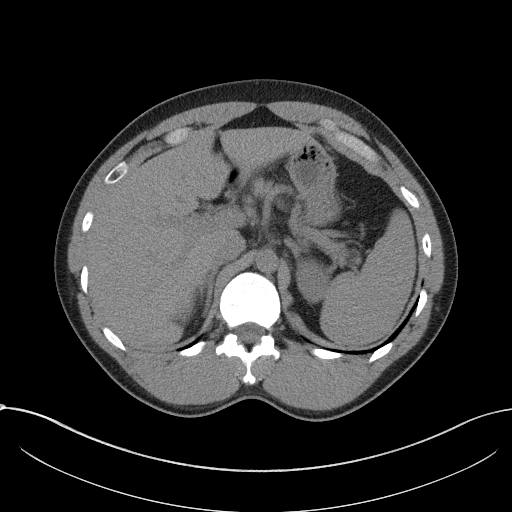
[im 99/113  soft-tissue]
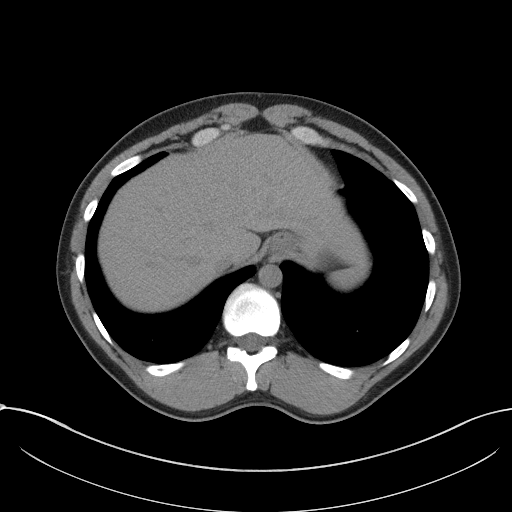
[im 108/113  soft-tissue]
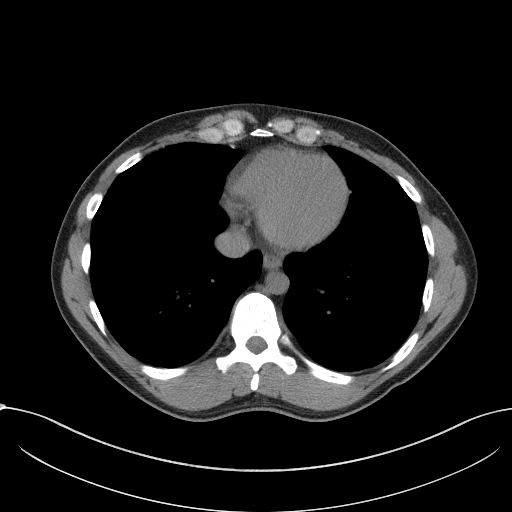

[Series 5: renal stone 3.0 coronal · coronal · 0.76mm/px · 3 of 98 slices shown]
[im 33/98  soft-tissue]
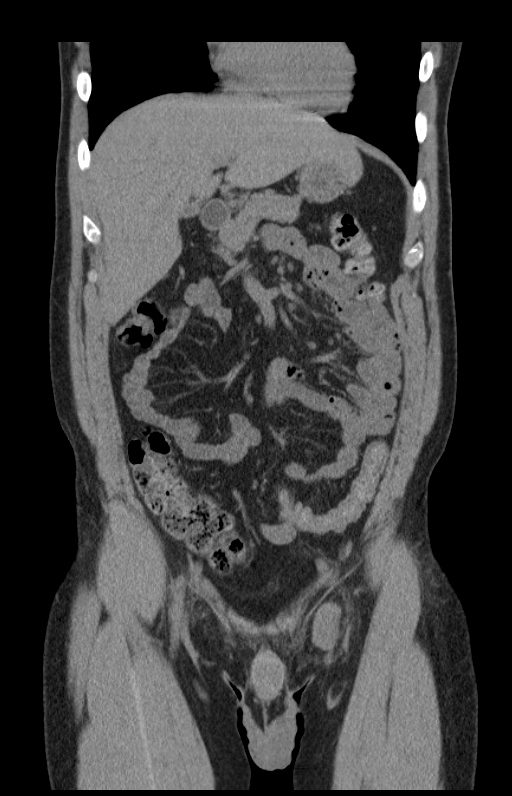
[im 44/98  soft-tissue]
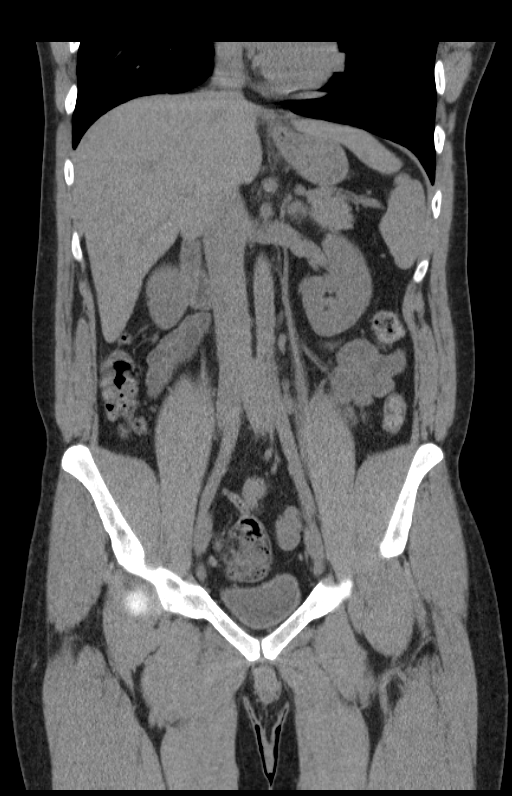
[im 54/98  soft-tissue]
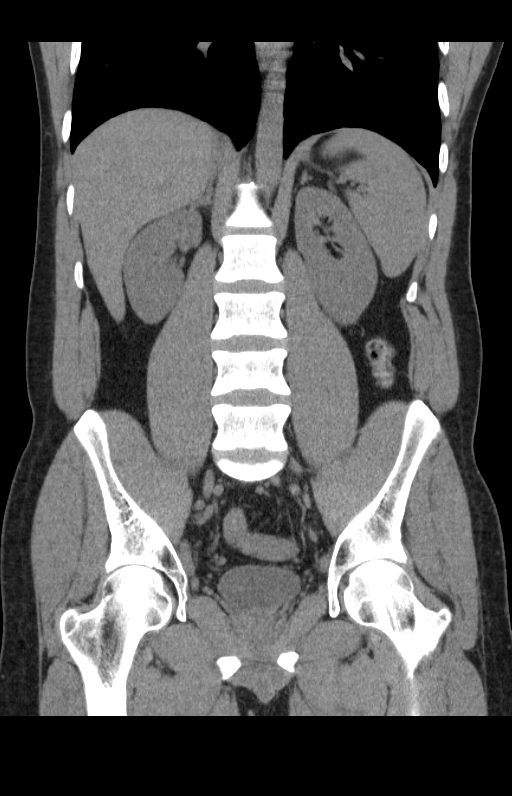

[15 of 46 positions shown; findings below may reference images not displayed]

FINDINGS: Evaluation of this exam is limited in the absence of intravenous
contrast.

The visualized lung bases are clear. No intra-abdominal free air or
free fluid.

The liver, gallbladder, pancreas, spleen, adrenal glands, kidneys,
visualized ureters, and urinary bladder appear unremarkable. The
prostate and seminal vesicles are grossly unremarkable.

Moderate stool throughout the colon. There is apparent thickening of
the wall of the descending colon, which although may partly be
related to under distention, int concerning for enterocolitis given
mild diffuse stranding of the mesentery and omentum. Clinical
correlation is recommended. There is no evidence of bowel
obstruction. Normal appendix.

The visualized abdominal aorta and IVC appear grossly unremarkable.
No portal venous gas identified. There is no adenopathy.

Small fat containing umbilical hernia. There are bilateral inguinal
adenopathy is with inflammatory changes and stranding of the stop
nodes and adjacent subcutaneous fat. These findings are likely
infectious in etiology. Clinical correlation and follow-up
recommended. No drainable fluid collection/abscess identified.
IMPRESSION: Apparent diffuse thickening of the colon with stranding of the
mesentery concerning for enterocolitis. Clinical correlation is
recommended.

Enlarged and inflamed bilateral inguinal lymph nodes. Correlation
with clinical exam and follow-up recommended.

## 2016-05-23 ENCOUNTER — Other Ambulatory Visit: Payer: Self-pay | Admitting: Infectious Disease

## 2016-05-23 DIAGNOSIS — B20 Human immunodeficiency virus [HIV] disease: Secondary | ICD-10-CM

## 2016-06-08 ENCOUNTER — Encounter (HOSPITAL_BASED_OUTPATIENT_CLINIC_OR_DEPARTMENT_OTHER): Payer: Self-pay | Admitting: Emergency Medicine

## 2016-06-08 ENCOUNTER — Emergency Department (HOSPITAL_BASED_OUTPATIENT_CLINIC_OR_DEPARTMENT_OTHER)
Admission: EM | Admit: 2016-06-08 | Discharge: 2016-06-08 | Disposition: A | Discharge status: Home / Self Care | Payer: Managed Care, Other (non HMO) | Attending: Emergency Medicine | Admitting: Emergency Medicine

## 2016-06-08 DIAGNOSIS — F17210 Nicotine dependence, cigarettes, uncomplicated: Secondary | ICD-10-CM | POA: Diagnosis not present

## 2016-06-08 DIAGNOSIS — I10 Essential (primary) hypertension: Secondary | ICD-10-CM | POA: Diagnosis not present

## 2016-06-08 DIAGNOSIS — R51 Headache: Secondary | ICD-10-CM | POA: Diagnosis present

## 2016-06-08 DIAGNOSIS — J0100 Acute maxillary sinusitis, unspecified: Secondary | ICD-10-CM | POA: Diagnosis not present

## 2016-06-08 DIAGNOSIS — J45909 Unspecified asthma, uncomplicated: Secondary | ICD-10-CM | POA: Diagnosis not present

## 2016-06-08 DIAGNOSIS — Z202 Contact with and (suspected) exposure to infections with a predominantly sexual mode of transmission: Secondary | ICD-10-CM

## 2016-06-08 MED ORDER — CEFTRIAXONE SODIUM 250 MG IJ SOLR
250.0000 mg | Freq: Once | INTRAMUSCULAR | Status: AC
  Administered 2016-06-08: 250 mg via INTRAMUSCULAR
  Filled 2016-06-08: qty 250

## 2016-06-08 MED ORDER — IBUPROFEN 800 MG PO TABS
800.0000 mg | ORAL_TABLET | Freq: Once | ORAL | Status: AC
  Administered 2016-06-08: 800 mg via ORAL
  Filled 2016-06-08: qty 1

## 2016-06-08 MED ORDER — DOXYCYCLINE HYCLATE 100 MG PO CAPS: 100.0000 mg | ORAL_CAPSULE | Freq: Two times a day (BID) | ORAL | Status: AC

## 2016-06-08 NOTE — Discharge Instructions (Signed)
Return here as needed.  Follow-up with your primary care doctor, increase your fluid intake and rest as much as possible °

## 2016-06-08 NOTE — ED Provider Notes (Signed)
CSN: [id number]     Arrival date & time 06/08/16  1910 History   First MD Initiated Contact with Patient 06/08/16 2101     Chief Complaint  Patient presents with  . Facial Pain     (Consider location/radiation/quality/duration/timing/severity/associated sxs/prior Treatment) HPI Patient presents to the emergency department with a one-week history of left-sided maxillary sinus pain and is also concerned about STDs.  Patient states that he had sexual intercourse with another male anally and the condom broke and he is concerned about STDs.  Patient states that he has not had any symptoms of any STDs but is very concerned about it.  Patient states that he has had no Past Medical History  Diagnosis Date  . HIV disease (HCC)   . Hypertension   . Asthma   . Inguinal lymphadenopathy 09/08/2015  . Syphilis 09/08/2015  . Diarrhea 09/08/2015  . Chlamydia 02/18/2016  . Exposure to gonorrhea 02/18/2016   History reviewed. No pertinent past surgical history. Family History  Problem Relation Age of Onset  . Breast cancer Mother    Social History  Substance Use Topics  . Smoking status: Current Some Day Smoker -- 0.10 packs/day    Types: Cigarettes  . Smokeless tobacco: Never Used     Comment: no smoking for a couple of months  . Alcohol Use: 3.6 oz/week    6 Standard drinks or equivalent per week     Comment: occ    Review of Systems    Allergies  Sulfamethoxazole-trimethoprim  Home Medications   Prior to Admission medications   Medication Sig Start Date End Date Taking? Authorizing Provider  DESCOVY 200-25 MG tablet TAKE 1 TABLET BY MOUTH DAILY 05/24/16   Randall Hiss, MD  PREZCOBIX 800-150 MG tablet TAKE 1 TABLET BY MOUTH DAILY. SWALLOW WHOLE. DO NOT CRUSH, BREAK OR CHEW TABLETS.TK WITH FOOD 05/24/16   Randall Hiss, MD  terbinafine (LAMISIL AT) 1 % cream Apply 1 application topically 2 (two) times daily. Apply to affected toes twice daily. 12/26/15   John Molpus, MD  traZODone  (DESYREL) 50 MG tablet Take 1 tablet (50 mg total) by mouth at bedtime. 09/08/15   Randall Hiss, MD   BP 149/83 mmHg  Pulse 103  Temp(Src) 98.3 F (36.8 C) (Oral)  Resp 18  Ht  (1.981 m)  Wt 106.595 kg  BMI 27.16 kg/m2  SpO2 100% Physical Exam  Constitutional: He is oriented to person, place, and time. He appears well-developed and well-nourished. No distress.  HENT:  Head: Normocephalic and atraumatic.  Nose: Left sinus exhibits maxillary sinus tenderness and frontal sinus tenderness.  Mouth/Throat: Oropharynx is clear and moist.  Eyes: Pupils are equal, round, and reactive to light.  Neck: Normal range of motion. Neck supple.  Cardiovascular: Normal rate, regular rhythm and normal heart sounds.  Exam reveals no gallop and no friction rub.   No murmur heard. Pulmonary/Chest: Effort normal and breath sounds normal. No respiratory distress. He has no wheezes.  Neurological: He is alert and oriented to person, place, and time. He exhibits normal muscle tone. Coordination normal.  Skin: Skin is warm and dry. No rash noted. No erythema.  Psychiatric: He has a normal mood and affect. His behavior is normal.  Nursing note and vitals reviewed.   ED Course  Procedures (including critical care time) Labs Review Labs Reviewed - No data to display  Imaging Review No results found. I have personally reviewed and evaluated these images and  lab results as part of my medical decision-making.  Patient be treated for possible STD exposure, along with a sinusitis.  Patient is advised with plan.  All questions were answered.  Did advise him to follow up with his primary care doctor  Charlestine NightChristopher Paddy Neis, PA-C 06/08/16 2122  Arby BarretteMarcy Pfeiffer, MD 06/10/16 [phone]

## 2016-06-08 NOTE — ED Notes (Signed)
Patient states that he is having pain to his left face that he reports that he felt like it was a sinus infection x 1 week. The patient reports that he is also complaining of a possible STD

## 2016-06-08 NOTE — ED Notes (Signed)
D/C at 2145
